# Patient Record
Sex: Female | Born: 1935 | Race: White | Hispanic: No | Marital: Married | Smoking: Former smoker
Health system: Southern US, Community
[De-identification: ages and names within clinical notes are randomized; demographics above are authoritative.]

## PROBLEM LIST (undated history)

## (undated) DIAGNOSIS — M719 Bursopathy, unspecified: Secondary | ICD-10-CM

## (undated) DIAGNOSIS — K644 Residual hemorrhoidal skin tags: Secondary | ICD-10-CM

## (undated) DIAGNOSIS — M67919 Unspecified disorder of synovium and tendon, unspecified shoulder: Secondary | ICD-10-CM

## (undated) DIAGNOSIS — Z923 Personal history of irradiation: Secondary | ICD-10-CM

## (undated) DIAGNOSIS — M899 Disorder of bone, unspecified: Secondary | ICD-10-CM

## (undated) DIAGNOSIS — K5730 Diverticulosis of large intestine without perforation or abscess without bleeding: Secondary | ICD-10-CM

## (undated) DIAGNOSIS — F329 Major depressive disorder, single episode, unspecified: Secondary | ICD-10-CM

## (undated) DIAGNOSIS — C50919 Malignant neoplasm of unspecified site of unspecified female breast: Secondary | ICD-10-CM

## (undated) DIAGNOSIS — M069 Rheumatoid arthritis, unspecified: Secondary | ICD-10-CM

## (undated) DIAGNOSIS — M949 Disorder of cartilage, unspecified: Secondary | ICD-10-CM

## (undated) DIAGNOSIS — D0510 Intraductal carcinoma in situ of unspecified breast: Secondary | ICD-10-CM

## (undated) DIAGNOSIS — K219 Gastro-esophageal reflux disease without esophagitis: Secondary | ICD-10-CM

## (undated) HISTORY — DX: Malignant neoplasm of unspecified site of unspecified female breast: C50.919

## (undated) HISTORY — PX: CHOLECYSTECTOMY: SHX55

## (undated) HISTORY — PX: APPENDECTOMY: SHX54

## (undated) HISTORY — PX: DILATION AND CURETTAGE OF UTERUS: SHX78

## (undated) HISTORY — DX: Disorder of cartilage, unspecified: M94.9

## (undated) HISTORY — DX: Diverticulosis of large intestine without perforation or abscess without bleeding: K57.30

## (undated) HISTORY — DX: Gastro-esophageal reflux disease without esophagitis: K21.9

## (undated) HISTORY — DX: Intraductal carcinoma in situ of unspecified breast: D05.10

## (undated) HISTORY — DX: Disorder of bone, unspecified: M89.9

## (undated) HISTORY — PX: ABDOMINAL HYSTERECTOMY: SHX81

## (undated) HISTORY — DX: Residual hemorrhoidal skin tags: K64.4

## (undated) HISTORY — DX: Major depressive disorder, single episode, unspecified: F32.9

## (undated) HISTORY — DX: Rheumatoid arthritis, unspecified: M06.9

## (undated) HISTORY — PX: CATARACT EXTRACTION: SUR2

## (undated) HISTORY — DX: Unspecified disorder of synovium and tendon, unspecified shoulder: M67.919

## (undated) HISTORY — DX: Bursopathy, unspecified: M71.9

## (undated) HISTORY — PX: OTHER SURGICAL HISTORY: SHX169

## (undated) HISTORY — PX: RECTOCELE REPAIR: SHX761

---

## 1997-12-08 ENCOUNTER — Ambulatory Visit (HOSPITAL_COMMUNITY): Admission: RE | Admit: 1997-12-08 | Discharge: 1997-12-08 | Payer: Self-pay | Admitting: Family Medicine

## 1997-12-10 ENCOUNTER — Ambulatory Visit (HOSPITAL_COMMUNITY): Admission: RE | Admit: 1997-12-10 | Discharge: 1997-12-10 | Payer: Self-pay | Admitting: Family Medicine

## 1997-12-10 ENCOUNTER — Encounter: Payer: Self-pay | Admitting: Family Medicine

## 1998-01-03 ENCOUNTER — Ambulatory Visit (HOSPITAL_COMMUNITY): Admission: RE | Admit: 1998-01-03 | Discharge: 1998-01-03 | Payer: Self-pay | Admitting: *Deleted

## 1998-01-03 HISTORY — PX: BREAST BIOPSY: SHX20

## 1998-01-10 ENCOUNTER — Encounter: Admission: RE | Admit: 1998-01-10 | Discharge: 1998-04-10 | Payer: Self-pay | Admitting: Radiation Oncology

## 1998-01-19 HISTORY — PX: BREAST LUMPECTOMY: SHX2

## 1998-01-21 ENCOUNTER — Ambulatory Visit (HOSPITAL_COMMUNITY): Admission: RE | Admit: 1998-01-21 | Discharge: 1998-01-21 | Payer: Self-pay | Admitting: *Deleted

## 1998-02-14 ENCOUNTER — Ambulatory Visit (HOSPITAL_COMMUNITY): Admission: RE | Admit: 1998-02-14 | Discharge: 1998-02-14 | Payer: Self-pay | Admitting: *Deleted

## 1998-02-19 DIAGNOSIS — Z923 Personal history of irradiation: Secondary | ICD-10-CM

## 1998-02-19 HISTORY — DX: Personal history of irradiation: Z92.3

## 1998-04-11 ENCOUNTER — Encounter: Admission: RE | Admit: 1998-04-11 | Discharge: 1998-07-10 | Payer: Self-pay | Admitting: Radiation Oncology

## 1998-08-04 ENCOUNTER — Encounter: Payer: Self-pay | Admitting: Oncology

## 1998-08-04 ENCOUNTER — Ambulatory Visit (HOSPITAL_COMMUNITY): Admission: RE | Admit: 1998-08-04 | Discharge: 1998-08-04 | Payer: Self-pay | Admitting: Oncology

## 1999-02-02 ENCOUNTER — Encounter: Payer: Self-pay | Admitting: Oncology

## 1999-02-02 ENCOUNTER — Ambulatory Visit (HOSPITAL_COMMUNITY): Admission: RE | Admit: 1999-02-02 | Discharge: 1999-02-02 | Payer: Self-pay | Admitting: Oncology

## 1999-03-13 ENCOUNTER — Other Ambulatory Visit: Admission: RE | Admit: 1999-03-13 | Discharge: 1999-03-13 | Payer: Self-pay | Admitting: *Deleted

## 1999-08-07 ENCOUNTER — Encounter: Payer: Self-pay | Admitting: Oncology

## 1999-08-07 ENCOUNTER — Encounter: Admission: RE | Admit: 1999-08-07 | Discharge: 1999-08-07 | Payer: Self-pay | Admitting: Oncology

## 2000-01-23 ENCOUNTER — Encounter: Payer: Self-pay | Admitting: Oncology

## 2000-01-23 ENCOUNTER — Encounter: Admission: RE | Admit: 2000-01-23 | Discharge: 2000-01-23 | Payer: Self-pay | Admitting: Oncology

## 2001-02-24 ENCOUNTER — Encounter: Payer: Self-pay | Admitting: Oncology

## 2001-02-24 ENCOUNTER — Encounter: Admission: RE | Admit: 2001-02-24 | Discharge: 2001-02-24 | Payer: Self-pay | Admitting: Oncology

## 2002-02-25 ENCOUNTER — Encounter: Admission: RE | Admit: 2002-02-25 | Discharge: 2002-02-25 | Payer: Self-pay | Admitting: Oncology

## 2002-02-25 ENCOUNTER — Encounter: Payer: Self-pay | Admitting: Oncology

## 2002-11-13 ENCOUNTER — Encounter: Payer: Self-pay | Admitting: Internal Medicine

## 2002-11-13 HISTORY — PX: COLONOSCOPY: SHX5424

## 2003-03-01 ENCOUNTER — Encounter: Admission: RE | Admit: 2003-03-01 | Discharge: 2003-03-01 | Payer: Self-pay | Admitting: Internal Medicine

## 2003-12-23 ENCOUNTER — Ambulatory Visit: Payer: Self-pay | Admitting: Oncology

## 2004-01-17 ENCOUNTER — Encounter: Payer: Self-pay | Admitting: Internal Medicine

## 2004-03-01 ENCOUNTER — Encounter: Admission: RE | Admit: 2004-03-01 | Discharge: 2004-03-01 | Payer: Self-pay | Admitting: Oncology

## 2004-03-03 ENCOUNTER — Ambulatory Visit: Payer: Self-pay | Admitting: Internal Medicine

## 2004-05-30 ENCOUNTER — Ambulatory Visit: Payer: Self-pay | Admitting: Internal Medicine

## 2004-11-28 ENCOUNTER — Ambulatory Visit: Payer: Self-pay | Admitting: Internal Medicine

## 2005-03-26 ENCOUNTER — Encounter: Admission: RE | Admit: 2005-03-26 | Discharge: 2005-03-26 | Payer: Self-pay | Admitting: Internal Medicine

## 2005-05-22 ENCOUNTER — Ambulatory Visit: Payer: Self-pay | Admitting: Internal Medicine

## 2005-05-29 ENCOUNTER — Ambulatory Visit: Payer: Self-pay | Admitting: Internal Medicine

## 2005-12-26 ENCOUNTER — Ambulatory Visit: Payer: Self-pay | Admitting: Internal Medicine

## 2006-03-28 ENCOUNTER — Encounter: Payer: Self-pay | Admitting: Internal Medicine

## 2006-03-28 ENCOUNTER — Encounter: Admission: RE | Admit: 2006-03-28 | Discharge: 2006-03-28 | Payer: Self-pay | Admitting: Internal Medicine

## 2006-04-04 ENCOUNTER — Encounter: Admission: RE | Admit: 2006-04-04 | Discharge: 2006-04-04 | Payer: Self-pay | Admitting: Internal Medicine

## 2006-04-04 ENCOUNTER — Encounter: Payer: Self-pay | Admitting: Internal Medicine

## 2006-06-04 ENCOUNTER — Ambulatory Visit: Payer: Self-pay | Admitting: Internal Medicine

## 2006-06-04 LAB — CONVERTED CEMR LAB
ALT: 16 units/L (ref 0–40)
Alkaline Phosphatase: 101 units/L (ref 39–117)
Basophils Absolute: 0 10*3/uL (ref 0.0–0.1)
Basophils Relative: 0.9 % (ref 0.0–1.0)
Calcium: 9.6 mg/dL (ref 8.4–10.5)
Chloride: 108 meq/L (ref 96–112)
Eosinophils Absolute: 0.1 10*3/uL (ref 0.0–0.6)
Eosinophils Relative: 2.3 % (ref 0.0–5.0)
Glucose, Bld: 78 mg/dL (ref 70–99)
HCT: 36.1 % (ref 36.0–46.0)
Hemoglobin: 12.6 g/dL (ref 12.0–15.0)
MCHC: 34.9 g/dL (ref 30.0–36.0)
MCV: 91.3 fL (ref 78.0–100.0)
Monocytes Absolute: 0.4 10*3/uL (ref 0.2–0.7)
Monocytes Relative: 7.5 % (ref 3.0–11.0)
Platelets: 245 10*3/uL (ref 150–400)
RBC: 3.96 M/uL (ref 3.87–5.11)
RDW: 12.9 % (ref 11.5–14.6)
Total Protein: 6.7 g/dL (ref 6.0–8.3)
WBC: 5.4 10*3/uL (ref 4.5–10.5)

## 2006-06-11 ENCOUNTER — Ambulatory Visit: Payer: Self-pay | Admitting: Internal Medicine

## 2006-07-29 ENCOUNTER — Ambulatory Visit: Payer: Self-pay | Admitting: Internal Medicine

## 2006-09-16 ENCOUNTER — Telehealth: Payer: Self-pay | Admitting: Internal Medicine

## 2006-09-24 ENCOUNTER — Encounter: Payer: Self-pay | Admitting: Internal Medicine

## 2006-11-29 ENCOUNTER — Ambulatory Visit: Payer: Self-pay | Admitting: Internal Medicine

## 2006-11-29 DIAGNOSIS — M899 Disorder of bone, unspecified: Secondary | ICD-10-CM | POA: Insufficient documentation

## 2006-11-29 DIAGNOSIS — M069 Rheumatoid arthritis, unspecified: Secondary | ICD-10-CM | POA: Insufficient documentation

## 2006-11-29 DIAGNOSIS — M949 Disorder of cartilage, unspecified: Secondary | ICD-10-CM

## 2006-11-29 HISTORY — DX: Disorder of bone, unspecified: M89.9

## 2006-11-29 HISTORY — DX: Rheumatoid arthritis, unspecified: M06.9

## 2007-02-24 ENCOUNTER — Ambulatory Visit: Payer: Self-pay | Admitting: Internal Medicine

## 2007-02-24 DIAGNOSIS — K219 Gastro-esophageal reflux disease without esophagitis: Secondary | ICD-10-CM

## 2007-02-24 DIAGNOSIS — F3289 Other specified depressive episodes: Secondary | ICD-10-CM

## 2007-02-24 DIAGNOSIS — K5730 Diverticulosis of large intestine without perforation or abscess without bleeding: Secondary | ICD-10-CM

## 2007-02-24 DIAGNOSIS — E785 Hyperlipidemia, unspecified: Secondary | ICD-10-CM | POA: Insufficient documentation

## 2007-02-24 DIAGNOSIS — F329 Major depressive disorder, single episode, unspecified: Secondary | ICD-10-CM

## 2007-02-24 HISTORY — DX: Gastro-esophageal reflux disease without esophagitis: K21.9

## 2007-02-24 HISTORY — DX: Diverticulosis of large intestine without perforation or abscess without bleeding: K57.30

## 2007-02-24 HISTORY — DX: Other specified depressive episodes: F32.89

## 2007-02-24 HISTORY — DX: Major depressive disorder, single episode, unspecified: F32.9

## 2007-04-01 ENCOUNTER — Encounter: Admission: RE | Admit: 2007-04-01 | Discharge: 2007-04-01 | Payer: Self-pay | Admitting: Internal Medicine

## 2007-06-11 ENCOUNTER — Encounter: Payer: Self-pay | Admitting: Internal Medicine

## 2007-06-12 ENCOUNTER — Ambulatory Visit: Payer: Self-pay | Admitting: Internal Medicine

## 2007-06-16 LAB — CONVERTED CEMR LAB
ALT: 16 units/L (ref 0–35)
AST: 24 units/L (ref 0–37)
Albumin: 4.1 g/dL (ref 3.5–5.2)
Basophils Absolute: 0 10*3/uL (ref 0.0–0.1)
Basophils Relative: 0 % (ref 0.0–1.0)
Bilirubin, Direct: 0.1 mg/dL (ref 0.0–0.3)
Direct LDL: 126.1 mg/dL
GFR calc Af Amer: 70 mL/min
HCT: 38.4 % (ref 36.0–46.0)
HDL: 80 mg/dL (ref >39.0)
MCV: 95.1 fL (ref 78.0–100.0)
Monocytes Absolute: 0.3 10*3/uL (ref 0.1–1.0)
Monocytes Relative: 3.2 % (ref 3.0–12.0)
Neutro Abs: 7.6 10*3/uL (ref 1.4–7.7)
Platelets: 280 10*3/uL (ref 150–400)
Sodium: 144 meq/L (ref 135–145)
Total Protein: 7.1 g/dL (ref 6.0–8.3)
WBC: 9.4 10*3/uL (ref 4.5–10.5)

## 2007-07-15 ENCOUNTER — Ambulatory Visit: Payer: Self-pay | Admitting: Internal Medicine

## 2007-07-15 DIAGNOSIS — N390 Urinary tract infection, site not specified: Secondary | ICD-10-CM | POA: Insufficient documentation

## 2007-07-15 LAB — CONVERTED CEMR LAB
Bilirubin Urine: NEGATIVE
Nitrite: POSITIVE
Protein, U semiquant: NEGATIVE
Specific Gravity, Urine: <1.005
Urobilinogen, UA: NEGATIVE
pH: 6.5

## 2007-10-29 ENCOUNTER — Encounter: Payer: Self-pay | Admitting: Internal Medicine

## 2007-12-08 ENCOUNTER — Telehealth: Payer: Self-pay | Admitting: Internal Medicine

## 2008-02-20 HISTORY — PX: ROTATOR CUFF REPAIR: SHX139

## 2008-04-19 ENCOUNTER — Telehealth: Payer: Self-pay | Admitting: Internal Medicine

## 2008-04-21 ENCOUNTER — Ambulatory Visit: Payer: Self-pay | Admitting: Internal Medicine

## 2008-04-21 ENCOUNTER — Encounter: Admission: RE | Admit: 2008-04-21 | Discharge: 2008-04-21 | Payer: Self-pay | Admitting: Internal Medicine

## 2008-06-17 ENCOUNTER — Ambulatory Visit: Payer: Self-pay | Admitting: Internal Medicine

## 2008-06-18 LAB — CONVERTED CEMR LAB
ALT: 13 U/L (ref 0–35)
AST: 20 U/L (ref 0–37)
Albumin: 4.3 g/dL (ref 3.5–5.2)
Alkaline Phosphatase: 82 U/L (ref 39–117)
BUN: 16 mg/dL (ref 6–23)
Basophils Absolute: 0.1 K/uL (ref 0.0–0.1)
Basophils Relative: 1.6 % (ref 0.0–3.0)
Bilirubin, Direct: 0.1 mg/dL (ref 0.0–0.3)
CO2: 27 meq/L (ref 19–32)
Calcium: 9.6 mg/dL (ref 8.4–10.5)
Chloride: 110 meq/L (ref 96–112)
Cholesterol: 238 mg/dL — ABNORMAL HIGH (ref 0–200)
Creatinine, Ser: 1 mg/dL (ref 0.4–1.2)
Direct LDL: 136.9 mg/dL
Eosinophils Absolute: 0.1 K/uL (ref 0.0–0.7)
Eosinophils Relative: 2.3 % (ref 0.0–5.0)
GFR calc non Af Amer: 57.65 mL/min (ref >60)
Glucose, Bld: 89 mg/dL (ref 70–99)
HCT: 36.8 % (ref 36.0–46.0)
HDL: 69.9 mg/dL (ref >39.00)
Hemoglobin: 13 g/dL (ref 12.0–15.0)
Lymphocytes Relative: 23.4 % (ref 12.0–46.0)
Lymphs Abs: 1.4 K/uL (ref 0.7–4.0)
MCHC: 35.3 g/dL (ref 30.0–36.0)
MCV: 93.1 fL (ref 78.0–100.0)
Monocytes Absolute: 0.5 K/uL (ref 0.1–1.0)
Monocytes Relative: 7.5 % (ref 3.0–12.0)
Neutro Abs: 3.9 K/uL (ref 1.4–7.7)
Neutrophils Relative %: 65.2 % (ref 43.0–77.0)
Platelets: 233 K/uL (ref 150.0–400.0)
Potassium: 4.1 meq/L (ref 3.5–5.1)
RBC: 3.95 M/uL (ref 3.87–5.11)
RDW: 13 % (ref 11.5–14.6)
Sodium: 142 meq/L (ref 135–145)
TSH: 1.68 u[IU]/mL (ref 0.35–5.50)
Total Bilirubin: 0.8 mg/dL (ref 0.3–1.2)
Total CHOL/HDL Ratio: 3
Total Protein: 7.2 g/dL (ref 6.0–8.3)
Triglycerides: 99 mg/dL (ref 0.0–149.0)
VLDL: 19.8 mg/dL (ref 0.0–40.0)
WBC: 6 10*3/microliter (ref 4.5–10.5)

## 2008-08-09 ENCOUNTER — Ambulatory Visit: Payer: Self-pay | Admitting: Internal Medicine

## 2008-08-09 DIAGNOSIS — M67919 Unspecified disorder of synovium and tendon, unspecified shoulder: Secondary | ICD-10-CM | POA: Insufficient documentation

## 2008-08-09 DIAGNOSIS — M719 Bursopathy, unspecified: Secondary | ICD-10-CM

## 2008-08-09 HISTORY — DX: Unspecified disorder of synovium and tendon, unspecified shoulder: M67.919

## 2008-12-10 ENCOUNTER — Encounter: Admission: RE | Admit: 2008-12-10 | Discharge: 2008-12-10 | Payer: Self-pay | Admitting: Orthopaedic Surgery

## 2008-12-27 ENCOUNTER — Ambulatory Visit (HOSPITAL_COMMUNITY): Admission: RE | Admit: 2008-12-27 | Discharge: 2008-12-28 | Payer: Self-pay | Admitting: Orthopaedic Surgery

## 2008-12-27 ENCOUNTER — Encounter (INDEPENDENT_AMBULATORY_CARE_PROVIDER_SITE_OTHER): Payer: Self-pay | Admitting: Orthopaedic Surgery

## 2009-02-09 ENCOUNTER — Telehealth: Payer: Self-pay | Admitting: Internal Medicine

## 2009-02-18 ENCOUNTER — Telehealth: Payer: Self-pay | Admitting: Family Medicine

## 2009-04-22 ENCOUNTER — Encounter: Admission: RE | Admit: 2009-04-22 | Discharge: 2009-04-22 | Payer: Self-pay | Admitting: Internal Medicine

## 2009-04-22 LAB — HM MAMMOGRAPHY: HM Mammogram: NEGATIVE

## 2009-05-30 ENCOUNTER — Telehealth: Payer: Self-pay

## 2009-06-22 ENCOUNTER — Ambulatory Visit: Payer: Self-pay | Admitting: Internal Medicine

## 2009-06-22 LAB — CONVERTED CEMR LAB
ALT: 15 units/L (ref 0–35)
BUN: 15 mg/dL (ref 6–23)
Basophils Absolute: 0 10*3/uL (ref 0.0–0.1)
Bilirubin, Direct: 0 mg/dL (ref 0.0–0.3)
Chloride: 106 meq/L (ref 96–112)
Cholesterol: 245 mg/dL — ABNORMAL HIGH (ref 0–200)
Creatinine, Ser: 1.1 mg/dL (ref 0.4–1.2)
Eosinophils Relative: 2.9 % (ref 0.0–5.0)
Glucose, Bld: 82 mg/dL (ref 70–99)
Hemoglobin: 12.9 g/dL (ref 12.0–15.0)
MCV: 94.4 fL (ref 78.0–100.0)
Monocytes Absolute: 0.5 10*3/uL (ref 0.1–1.0)
Neutro Abs: 4.1 10*3/uL (ref 1.4–7.7)
Neutrophils Relative %: 64.6 % (ref 43.0–77.0)
Potassium: 4.3 meq/L (ref 3.5–5.1)
RBC: 4.04 M/uL (ref 3.87–5.11)
TSH: 2.64 microintl units/mL (ref 0.35–5.50)
Triglycerides: 156 mg/dL — ABNORMAL HIGH (ref 0.0–149.0)
VLDL: 31.2 mg/dL (ref 0.0–40.0)

## 2009-06-30 ENCOUNTER — Telehealth: Payer: Self-pay

## 2009-09-19 ENCOUNTER — Telehealth: Payer: Self-pay | Admitting: Internal Medicine

## 2010-01-19 ENCOUNTER — Telehealth: Payer: Self-pay | Admitting: Internal Medicine

## 2010-03-12 ENCOUNTER — Encounter: Payer: Self-pay | Admitting: Internal Medicine

## 2010-03-21 NOTE — Progress Notes (Signed)
Summary: rerfill alprazolam  Phone Note Refill Request Message from:  Fax from Pharmacy on January 19, 2010 10:24 AM  Refills Requested: Medication #1:  ALPRAZOLAM 1 MG  TABS 1 once daily [facility]    Method Requested: Fax to Local Pharmacy Initial call taken by: Duard Brady LPN,  January 19, 2010 10:25 AM    Prescriptions: ALPRAZOLAM 1 MG  TABS (ALPRAZOLAM) 1 once daily  #90 x 1   Entered by:   Duard Brady LPN   Authorized by:   Gordy Savers  MD   Signed by:   Duard Brady LPN on 91/47/8295   Method used:   Historical   RxID:   [id number]  faxed back to [facility]   kik

## 2010-03-21 NOTE — Progress Notes (Signed)
Summary: lab results  Phone Note Call from Patient   Caller: Patient Call For: Gordy Savers  MD Reason for Call: Lab or Test Results Summary of Call: Pt is asking for lab results from last week. [phone] Initial call taken by: Lynann Beaver CMA,  Jun 30, 2009 4:43 PM  Follow-up for Phone Call        all lab is OK except mild elevation of cholesterol but with high  "good " cholesterol Follow-up by: Gordy Savers  MD,  Jun 30, 2009 5:00 PM  Additional Follow-up for Phone Call Additional follow up Details #1::        spoke with pt - discussed labs - dicussed diet and exercise. Pt request copy of lab to be sent to home address. KIK Additional Follow-up by: Duard Brady LPN,  Jul 01, 2009 8:58 AM    Additional Follow-up for Phone Call Additional follow up Details #2::    mail labs to home address  KIK Follow-up by: Duard Brady LPN,  Jul 04, 2009 10:18 AM

## 2010-03-21 NOTE — Progress Notes (Signed)
Summary: med ?  Phone Note Call from Patient   Caller: Patient Call For: Gordy Savers  MD Summary of Call: Pt has been taking 2.5 mg. of Prednisone, and wants to discontinue.  How should she taper? [phone] Initial call taken by: Lynann Beaver CMA,  September 19, 2009 1:15 PM  Follow-up for Phone Call        suggested she take the medication every other day in the morning for 4 weeks;  if she feels well on this regimen may discontinued after one month Follow-up by: Gordy Savers  MD,  September 19, 2009 5:02 PM  Additional Follow-up for Phone Call Additional follow up Details #1::        LMTCB  Pt notified. Lynann Beaver CMA  September 20, 2009 9:40 AM  Additional Follow-up by: Lynann Beaver CMA,  September 20, 2009 8:15 AM

## 2010-03-21 NOTE — Progress Notes (Signed)
Summary: Pt said prednisone hasnt been called in yet. Pls call in today  Phone Note Call from Patient Call back at Home Phone [phone]   Caller: Patient Summary of Call: Pt called and said that her script for Prednisone has not been called in yet for 1mg . Pt went on saturday to pick up and nothing had been done. Please call in to [facility] today. Pt wants to pick this up this afternoon.  Pt has sch an ov for Jun 22, 2009.    Initial call taken by: Lucy Antigua,  May 30, 2009 1:55 PM  Follow-up for Phone Call        spoke with pt -rx for predinosone called to [facility] - will need to keep cpx . KIK Follow-up by: Duard Brady LPN,  May 30, 2009 2:04 PM

## 2010-03-21 NOTE — Assessment & Plan Note (Signed)
Summary: EMP--WILL FAST//CCM   Vital Signs:  Patient profile:   77 year old female Height:      63.25 inches Weight:      128 pounds BMI:     22.58 Temp:     97.7 degrees F oral BP sitting:   130 / 70  (right arm) Cuff size:   regular  Vitals Entered By: Duard Brady LPN (Jun 22, 1608 9:25 AM) CC: cpx - fasting for labs - doing well  Is Patient Diabetic? No   CC:  cpx - fasting for labs - doing well .  History of Present Illness: 77 -year-old patient who is seen today for a comprehensive evaluation.  She has a history of seronegative RA well-controlled on low-dose prednisone therapy.  She has a history of osteopenia.  She has dyslipidemia gastroesophageal read clocks disease.  Her last visit here she has had left shoulder surgery and has done quite well. Complaints today include paroxysms of nausea and vomiting he seem to be associated with brief episodes of diarrhea as well they occur infrequently but quite bothersome.  She is up-to-date on her colonoscopies.  She mammogram done this past brain. Here for Medicare AWV:  1.   Risk factors based on Past M, S, F history:  cardiac risk factors include a history of mild dyslipidemia only 2.   Physical Activities: remains active in all aspects of daily living; walks on a regular basis 3.   Depression/mood:  history of depression which has been stable. 4.   Hearing: no deficits 5.   ADL's: the limitations 6.   Fall Risk: low 7.   Home Safety: no problems identified 8.   Height, weight, &visual acuity:  height and weight stable visual acuity normal has had a recent eye exam with Dr. Sherryle Lis 9.   Counseling: have recommended more vigorous home exercises for her right shoulder to help improve range of motion 10.   Labs ordered based on risk factors: laboratory studies will be reviewed including a sedimentation rate 11.           Referral Coordination non-applicable 12.           Care Plan- heart healthy diet and more regular exercise  regimen discussed 13.            Cognitive Assessment- alert and oriented with normal affect   Preventive Screening-Counseling & Management  Alcohol-Tobacco     Smoking Status: quit  Allergies: 1)  ! Amoxicillin (Amoxicillin)  Past History:  Past Medical History: Reviewed history from 06/17/2008 and no changes required. 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 father history prostate cancer mother history of lung cancer, coronary artery disease  Four brothers, renal failure, CAD one sister, obesity DJD  Social History: Reviewed history from 06/17/2008 and no changes required. Married Regular exercise-yes Former Smoker-d/c 30 yrs ago  Review of Systems  The patient denies anorexia, fever, weight loss, weight gain, vision loss, decreased hearing, hoarseness, chest pain, syncope, dyspnea on exertion, peripheral edema, prolonged cough, headaches, hemoptysis, abdominal pain, melena, hematochezia, severe indigestion/heartburn, hematuria, incontinence, genital sores, muscle weakness, suspicious skin lesions, transient blindness, difficulty walking, depression, unusual weight change, abnormal bleeding, enlarged lymph nodes, angioedema, and breast masses.  Physical Exam  General:  Well-developed,well-nourished,in no acute distress; alert,appropriate and cooperative throughout examination Head:  Normocephalic and atraumatic without obvious abnormalities. No apparent alopecia or balding. Eyes:  No corneal or conjunctival inflammation noted. EOMI. Perrla. Funduscopic exam benign, without hemorrhages, exudates or papilledema.  Vision grossly normal. Ears:  External ear exam shows no significant lesions or deformities.  Otoscopic examination reveals clear canals, tympanic membranes are intact bilaterally without bulging, retraction, inflammation or discharge. Hearing is grossly normal bilaterally. Nose:  External nasal examination shows no deformity or inflammation. Nasal mucosa are pink and moist without lesions or exudates. Mouth:  Oral mucosa and oropharynx without lesions or exudates.  dentures in place Neck:  No deformities, masses, or tenderness noted. Chest Wall:  No deformities, masses, or tenderness noted. Breasts:  No mass, nodules, thickening, tenderness, bulging, retraction, inflamation, nipple discharge or skin changes noted.   Lungs:  Normal respiratory effort, chest expands symmetrically. Lungs are clear to auscultation, no crackles or wheezes. Heart:  Normal rate and regular rhythm. S1 and S2 normal without gallop, murmur, click, rub or other extra sounds. Abdomen:  Bowel sounds positive,abdomen soft and non-tender without masses, organomegaly or hernias noted. Rectal:  declines Genitalia:  declines Msk:  No deformity or scoliosis noted of thoracic or lumbar spine.   no active synovitis Pulses:  pedal pulses, full Extremities:  No clubbing, cyanosis, edema, or deformity noted with normal full range of motion of all joints.   Neurologic:  alert & oriented X3, strength normal in all extremities, gait normal, and DTRs symmetrical and normal.  alert & oriented X3, strength normal in all extremities, gait normal, and DTRs symmetrical and normal.   Skin:  Intact without suspicious lesions or rashes Cervical Nodes:  No lymphadenopathy noted Axillary Nodes:  No palpable lymphadenopathy Inguinal Nodes:  No significant adenopathy Psych:  Cognition and judgment appear intact. Alert and cooperative with normal attention span and concentration. No apparent delusions, illusions, hallucinations   Impression &  Recommendations:  Problem # 1:  PREVENTIVE HEALTH CARE (ICD-V70.0)  Orders: EKG w/ Interpretation (93000) First annual wellness visit with prevention plan  (Z3086)  Complete Medication List: 1)  Wellbutrin Xl 300 Mg Tb24 (Bupropion hcl) .Marland Kitchen.. 1 once daily 2)  Alprazolam 1 Mg Tabs (Alprazolam) .Marland Kitchen.. 1 once daily 3)  Alendronate Sodium 70 Mg Tabs (Alendronate sodium) .... One weekly 4)  Pantoprazole Sodium 40 Mg Tbec (Pantoprazole sodium) .Marland Kitchen.. 1 once daily 5)  Prednisone 1 Mg Tabs (Prednisone) .... Take one daily as directed 6)  Prednisone 2.5 Mg Tabs (Prednisone) .... One daily  Other Orders: Venipuncture (57846) TLB-Lipid Panel (80061-LIPID) TLB-BMP (Basic Metabolic Panel-BMET) (80048-METABOL) TLB-CBC Platelet - w/Differential (85025-CBCD) TLB-Hepatic/Liver Function Pnl (80076-HEPATIC) TLB-TSH (Thyroid Stimulating Hormone) (84443-TSH) TLB-Sedimentation Rate (ESR) (85652-ESR)  Patient Instructions: 1)  Limit your Sodium (Salt) to less than 2 grams a day(slightly less than 1/2 a teaspoon) to prevent fluid retention, swelling, or worsening of symptoms. 2)  It is important that you exercise regularly at least 20 minutes 5 times a week. If you develop chest pain, have severe difficulty breathing, or feel very tired , stop exercising immediately and seek medical attention. 3)  Take calcium +Vitamin D daily. Prescriptions: PREDNISONE 2.5 MG TABS (PREDNISONE) one daily  #100 x 2   Entered and Authorized by:   Gordy Savers  MD   Signed by:   Gordy Savers  MD on 06/22/2009   Method used:   Print then Give to Patient   RxID:  [id number] PREDNISONE 1 MG TABS (PREDNISONE) take one daily as directed  #100 x 2   Entered and Authorized by:   Gordy Savers  MD   Signed by:   Gordy Savers  MD on 06/22/2009   Method used:   Print then Give to Patient   RxID:   [id number] PANTOPRAZOLE SODIUM 40 MG TBEC (PANTOPRAZOLE SODIUM) 1 once daily  #90 Tablet x 1    Entered and Authorized by:   Gordy Savers  MD   Signed by:   Gordy Savers  MD on 06/22/2009   Method used:   Print then Give to Patient   RxID:   [id number] ALENDRONATE SODIUM 70 MG  TABS (ALENDRONATE SODIUM) one weekly  #12 Tablet x 6   Entered and Authorized by:   Gordy Savers  MD   Signed by:   Gordy Savers  MD on 06/22/2009   Method used:   Print then Give to Patient   RxID:   [id number] ALPRAZOLAM 1 MG  TABS (ALPRAZOLAM) 1 once daily  #90 x 1   Entered and Authorized by:   Gordy Savers  MD   Signed by:   Gordy Savers  MD on 06/22/2009   Method used:   Print then Give to Patient   RxID:   [id number] WELLBUTRIN XL 300 MG  TB24 (BUPROPION HCL) 1 once daily  #90 Tablet x 2   Entered and Authorized by:   Gordy Savers  MD   Signed by:   Gordy Savers  MD on 06/22/2009   Method used:   Print then Give to Patient   RxID:   [id number] PREDNISONE 2.5 MG TABS (PREDNISONE) one daily  #100 x 2   Entered and Authorized by:   Gordy Savers  MD   Signed by:   Gordy Savers  MD on 06/22/2009   Method used:   Print then Give to Patient   RxID:   [id number] PREDNISONE 1 MG TABS (PREDNISONE) take one daily as directed  #100 x 2   Entered and Authorized by:   Gordy Savers  MD   Signed by:   Gordy Savers  MD on 06/22/2009   Method used:   Print then Give to Patient   RxID:   [id number] PANTOPRAZOLE SODIUM 40 MG TBEC (PANTOPRAZOLE SODIUM) 1 once daily  #90 Tablet x 1   Entered and Authorized by:   Gordy Savers  MD   Signed by:   Gordy Savers  MD on 06/22/2009   Method used:   Print then Give to Patient   RxID:   [id number] ALENDRONATE SODIUM 70 MG  TABS (ALENDRONATE SODIUM) one weekly  #12 Tablet x 6   Entered and Authorized by:   Gordy Savers  MD   Signed by:   Gordy Savers  MD on 06/22/2009   Method used:   Print then Give to Patient   RxID:    [id number] ALPRAZOLAM 1 MG  TABS (ALPRAZOLAM) 1 once daily  #90 x 1   Entered and Authorized by:   Gordy Savers  MD   Signed by:   Gordy Savers  MD on 06/22/2009   Method used:   Print then Give to Patient   RxID:   [id number] WELLBUTRIN XL 300 MG  TB24 (BUPROPION HCL) 1 once daily  #90 Tablet x 2   Entered and Authorized by:   Janett Labella  Amador Cunas  MD   Signed by:   Gordy Savers  MD on 06/22/2009   Method used:   Print then Give to Patient   RxID:   [id number] PREDNISONE 2.5 MG TABS (PREDNISONE) one daily  #100 x 2   Entered and Authorized by:   Gordy Savers  MD   Signed by:   Gordy Savers  MD on 06/22/2009   Method used:   Print then Give to Patient   RxID:   [id number] PREDNISONE 1 MG TABS (PREDNISONE) take one daily as directed  #100 x 2   Entered and Authorized by:   Gordy Savers  MD   Signed by:   Gordy Savers  MD on 06/22/2009   Method used:   Print then Give to Patient   RxID:   [id number] PANTOPRAZOLE SODIUM 40 MG TBEC (PANTOPRAZOLE SODIUM) 1 once daily  #90 Tablet x 1   Entered and Authorized by:   Gordy Savers  MD   Signed by:   Gordy Savers  MD on 06/22/2009   Method used:   Print then Give to Patient   RxID:   [id number] ALENDRONATE SODIUM 70 MG  TABS (ALENDRONATE SODIUM) one weekly  #12 Tablet x 6   Entered and Authorized by:   Gordy Savers  MD   Signed by:   Gordy Savers  MD on 06/22/2009   Method used:   Print then Give to Patient   RxID:   [id number] ALPRAZOLAM 1 MG  TABS (ALPRAZOLAM) 1 once daily  #90 x 1   Entered and Authorized by:   Gordy Savers  MD   Signed by:   Gordy Savers  MD on 06/22/2009   Method used:   Print then Give to Patient   RxID:   [id number] WELLBUTRIN XL 300 MG  TB24 (BUPROPION HCL) 1 once daily  #90 Tablet x 2   Entered and Authorized by:   Gordy Savers  MD   Signed by:   Gordy Savers  MD  on 06/22/2009   Method used:   Print then Give to Patient   RxID:   [id number]   Appended Document: EMP--WILL FAST//CCM Addendum:  Seronegative RA- f/u Rheumatology osteopenia- continue calcium and vitamin D supplements Dyslipidemia- continue heart healthy diet GERD-  continue anti-reflux measures

## 2010-03-27 ENCOUNTER — Other Ambulatory Visit: Payer: Self-pay | Admitting: Internal Medicine

## 2010-03-27 DIAGNOSIS — Z1231 Encounter for screening mammogram for malignant neoplasm of breast: Secondary | ICD-10-CM

## 2010-04-25 ENCOUNTER — Ambulatory Visit
Admission: RE | Admit: 2010-04-25 | Discharge: 2010-04-25 | Disposition: A | Discharge status: Home / Self Care | Payer: Medicare Other | Source: Ambulatory Visit | Attending: Internal Medicine | Admitting: Internal Medicine

## 2010-04-25 DIAGNOSIS — Z1231 Encounter for screening mammogram for malignant neoplasm of breast: Secondary | ICD-10-CM

## 2010-05-24 LAB — URINALYSIS, ROUTINE W REFLEX MICROSCOPIC
Glucose, UA: NEGATIVE mg/dL
Ketones, ur: NEGATIVE mg/dL
Nitrite: NEGATIVE
Specific Gravity, Urine: 1.012 (ref 1.005–1.030)

## 2010-05-24 LAB — COMPREHENSIVE METABOLIC PANEL
ALT: 12 U/L (ref 0–35)
Albumin: 4.2 g/dL (ref 3.5–5.2)
BUN: 13 mg/dL (ref 6–23)
CO2: 27 mEq/L (ref 19–32)
Calcium: 10 mg/dL (ref 8.4–10.5)
Chloride: 106 mEq/L (ref 96–112)
Sodium: 141 mEq/L (ref 135–145)
Total Bilirubin: 0.5 mg/dL (ref 0.3–1.2)

## 2010-05-24 LAB — URINE MICROSCOPIC-ADD ON

## 2010-05-24 LAB — DIFFERENTIAL
Eosinophils Relative: 1 % (ref 0–5)
Lymphs Abs: 1.2 10*3/uL (ref 0.7–4.0)
Monocytes Absolute: 0.4 10*3/uL (ref 0.1–1.0)
Neutrophils Relative %: 83 % — ABNORMAL HIGH (ref 43–77)

## 2010-05-24 LAB — CBC
HCT: 38 % (ref 36.0–46.0)
Hemoglobin: 13.1 g/dL (ref 12.0–15.0)
MCHC: 34.4 g/dL (ref 30.0–36.0)

## 2010-06-26 ENCOUNTER — Encounter: Payer: Self-pay | Admitting: Internal Medicine

## 2010-06-29 ENCOUNTER — Encounter: Payer: Self-pay | Admitting: Internal Medicine

## 2010-06-29 ENCOUNTER — Ambulatory Visit (INDEPENDENT_AMBULATORY_CARE_PROVIDER_SITE_OTHER): Payer: Medicare Other | Admitting: Internal Medicine

## 2010-06-29 DIAGNOSIS — K219 Gastro-esophageal reflux disease without esophagitis: Secondary | ICD-10-CM

## 2010-06-29 DIAGNOSIS — Z136 Encounter for screening for cardiovascular disorders: Secondary | ICD-10-CM

## 2010-06-29 DIAGNOSIS — E785 Hyperlipidemia, unspecified: Secondary | ICD-10-CM

## 2010-06-29 DIAGNOSIS — M069 Rheumatoid arthritis, unspecified: Secondary | ICD-10-CM

## 2010-06-29 DIAGNOSIS — M899 Disorder of bone, unspecified: Secondary | ICD-10-CM

## 2010-06-29 DIAGNOSIS — Z0000 Encounter for general adult medical examination without abnormal findings: Secondary | ICD-10-CM

## 2010-06-29 DIAGNOSIS — M949 Disorder of cartilage, unspecified: Secondary | ICD-10-CM

## 2010-06-29 LAB — CBC WITH DIFFERENTIAL/PLATELET
Basophils Relative: 0.4 % (ref 0.0–3.0)
Eosinophils Absolute: 0.1 10*3/uL (ref 0.0–0.7)
Eosinophils Relative: 2.3 % (ref 0.0–5.0)
Hemoglobin: 12.1 g/dL (ref 12.0–15.0)
Lymphocytes Relative: 23.8 % (ref 12.0–46.0)
MCHC: 34.2 g/dL (ref 30.0–36.0)
MCV: 92.9 fl (ref 78.0–100.0)
Neutro Abs: 3.5 10*3/uL (ref 1.4–7.7)
RBC: 3.8 Mil/uL — ABNORMAL LOW (ref 3.87–5.11)
WBC: 5.2 10*3/uL (ref 4.5–10.5)

## 2010-06-29 LAB — HEPATIC FUNCTION PANEL
ALT: 11 U/L (ref 0–35)
Albumin: 4 g/dL (ref 3.5–5.2)
Alkaline Phosphatase: 101 U/L (ref 39–117)
Total Protein: 6.7 g/dL (ref 6.0–8.3)

## 2010-06-29 LAB — BASIC METABOLIC PANEL
BUN: 14 mg/dL (ref 6–23)
CO2: 29 mEq/L (ref 19–32)
Creatinine, Ser: 1 mg/dL (ref 0.4–1.2)
GFR: 58.68 mL/min — ABNORMAL LOW (ref >60.00)

## 2010-06-29 MED ORDER — BUPROPION HCL ER (XL) 300 MG PO TB24: 300.0000 mg | ORAL_TABLET | Freq: Every day | ORAL | Status: DC

## 2010-06-29 MED ORDER — ALPRAZOLAM 1 MG PO TABS: 1.0000 mg | ORAL_TABLET | Freq: Every day | ORAL | Status: DC

## 2010-06-29 MED ORDER — PANTOPRAZOLE SODIUM 40 MG PO TBEC: 40.0000 mg | DELAYED_RELEASE_TABLET | Freq: Every day | ORAL | Status: DC

## 2010-06-29 NOTE — Progress Notes (Signed)
Subjective:    Patient ID: Monique Crosby, female    DOB: [birth year], 77 y.o.   MRN: [id number]  HPI   77 year old patient who is seen today for her annual exam. She has self titrated herself off low-dose prednisone therapy. This had been prescribed for seronegative RA. She has a history of osteopenia and has also discontinued Fosamax. She is doing quite well today.1. Risk factors, based on past  M,S,F history  2.  Physical activities:  Remains quite active without exercise limitation  3.  Depression/mood: No history of depression or mood disorder. Does take Xanax infrequently due to anxiety  4.  Hearing: No deficits  5.  ADL's: Independent in all aspects of daily living  6.  Fall risk: Low  7.  Home safety: No problems identified  8.  Height weight, and visual acuity; height and weight stable. Weight is down a few pounds compared to her last exam. No low change in visual acuity  9.  Counseling: Heart healthy diet regular exercise encouraged she is on calcium and vitamin D supplements  10. Lab orders based on risk factors: Laboratory profile will be reviewed  11. Referral : Not appropriate at this time. Has had a recent mammogram  12. Care plan: Heart healthy diet annual mammogram and annual health assessment encouraged  13. Cognitive assessment: Alert and appropriate with normal affect no cognitive dysfunction    History of Present Illness:  85 -year-old patient who is seen today for a comprehensive evaluation. She has a history of seronegative RA well-controlled on low-dose prednisone therapy. She has a history of osteopenia. She has dyslipidemia gastroesophageal read clocks disease. Her last visit here she has had left shoulder surgery and has done quite well.  Complaints today include paroxysms of nausea and vomiting he seem to be associated with brief episodes of diarrhea as well they occur infrequently but quite bothersome. She is up-to-date on her colonoscopies. She mammogram  done this past brain.  Here for Medicare AWV:  1. Risk factors based on Past M, S, F history: cardiac risk factors include a history of mild dyslipidemia only  2. Physical Activities: remains active in all aspects of daily living; walks on a regular basis  3. Depression/mood: history of depression which has been stable.  4. Hearing: no deficits  5. ADL's: the limitations  6. Fall Risk: low  7. Home Safety: no problems identified  8. Height, weight, &visual acuity: height and weight stable visual acuity normal has had a recent eye exam with Dr. Sherryle Lis  9. Counseling: have recommended more vigorous home exercises for her right shoulder to help improve range of motion  10. Labs ordered based on risk factors: laboratory studies will be reviewed including a sedimentation rate  11. Referral Coordination non-applicable  12. Care Plan- heart healthy diet and more regular exercise regimen discussed  13. Cognitive Assessment- alert and oriented with normal affect  Preventive Screening-Counseling & Management  Alcohol-Tobacco  Smoking Status: quit  Allergies:  1) ! Amoxicillin (Amoxicillin)  Past History:  Past Medical History:  Reviewed history from 06/17/2008 and no changes required.  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  father history prostate cancer  mother history of lung cancer, coronary artery disease  Four brothers, renal failure, CAD  one sister, obesity DJD  Social History:  Reviewed history from 06/17/2008 and no changes required.  Married  Regular exercise-yes  Former Smoker-d/c 30 yrs ago     [employer] from Last 3 Encounters:  06/29/10 122 lb (55.339 kg)  06/22/09 128 lb (58.06 kg)  08/09/08 133 lb (60.328 kg)      Review of Systems  Constitutional: Negative for fever, appetite change, fatigue and unexpected weight change.  HENT: Negative for hearing loss, ear pain, nosebleeds, congestion, sore throat, mouth sores, trouble swallowing, neck stiffness, dental problem, voice change, sinus pressure and tinnitus.   Eyes: Negative for photophobia, pain, redness and visual disturbance.  Respiratory: Negative for cough, chest tightness and shortness of breath.   Cardiovascular: Negative for chest pain, palpitations and leg swelling.  Gastrointestinal: Negative for nausea, vomiting, abdominal pain, diarrhea, constipation, blood in stool, abdominal distention and rectal pain.  Genitourinary: Negative for dysuria, urgency, frequency, hematuria, flank pain, vaginal bleeding, vaginal discharge, difficulty urinating, genital sores, vaginal pain, menstrual problem and pelvic pain.  Musculoskeletal: Negative for back pain and arthralgias.  Skin: Negative for rash.  Neurological: Negative for dizziness, syncope, speech difficulty, weakness, light-headedness, numbness and headaches.  Hematological: Negative for adenopathy. Does not bruise/bleed easily.  Psychiatric/Behavioral: Negative for suicidal ideas, behavioral problems, self-injury, dysphoric mood and agitation. The patient is not nervous/anxious.        Objective:   Physical Exam  Constitutional: She is oriented to person, place, and time. She appears well-developed and well-nourished.  HENT:  Head: Normocephalic and atraumatic.  Right Ear: External ear normal.  Left Ear: External ear normal.  Mouth/Throat: Oropharynx is clear and moist.  Eyes: Conjunctivae and EOM are normal.  Neck: Normal range of motion. Neck supple. No JVD present. No thyromegaly present.  Cardiovascular: Normal rate, regular rhythm, normal heart sounds and  intact distal pulses.   No murmur heard. Pulmonary/Chest: Effort normal and breath sounds normal. She has no wheezes. She has no rales.  Abdominal: Soft. Bowel sounds are normal. She exhibits no distension and no mass. There is no tenderness. There is no rebound and no guarding.  Genitourinary: Vagina normal. Guaiac negative stool. No vaginal discharge found.  Musculoskeletal: Normal range of motion. She exhibits no edema and no tenderness.  Neurological: She is alert and oriented to person, place, and time. She has normal reflexes. No cranial nerve deficit. She exhibits normal muscle tone. Coordination normal.  Skin: Skin is warm and dry. No rash noted.  Psychiatric: She has a normal mood and affect. Her behavior is normal.          Assessment & Plan:   Unremarkable clinical exam Mild dyslipidemia with a high HDL Osteopenia. We'll continue calcium and vitamin D supplements History of seronegative RA. Quiescent

## 2010-06-29 NOTE — Patient Instructions (Signed)
Take a calcium supplement, plus 800-1200 units of vitamin D  Limit your sodium (Salt) intake    It is important that you exercise regularly, at least 20 minutes 3 to 4 times per week.  If you develop chest pain or shortness of breath seek  medical attention.  Return in one year for follow-up  

## 2010-07-04 NOTE — Assessment & Plan Note (Signed)
[facility]                            [facility] OFFICE NOTE   YARENIS, CERINO                        MRN:          [id number]  DATE:07/29/2006                            DOB:          May 16, 1932    A 77 year old female was seen approximately 6 weeks ago for an annual  physical.  At that time she presented with a 6-week history of pain and  stiffness involving the hands and wrists, primarily the MCP joints.  She  described one hour of early morning stiffness.  She did have a anti-CCP  antibody that was negative as well as a negative rheumatoid factor.  She  responded dramatically to a prednisone dose pack within 48 hours and  really did well until approximately 9 days ago.  Since that time, she  has had increasing pain and stiffness involving the hands and the  wrists.  She has developed nodules involving both ulnar sides of the  wrists.   PHYSICAL EXAMINATION:  MUSCULOSKELETAL:  Reveals active synovitis  involving the wrists and primarily the 1st and 2nd MCP joints of both  hands.   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   DISPOSITION:  She will be resumed on prednisone and be set up for a  rheumatology evaluation.     Gordy Savers, MD  Electronically Signed    PFK/MedQ  DD: 07/29/2006  DT: 07/29/2006  Job #: [id number]

## 2010-07-07 NOTE — Assessment & Plan Note (Signed)
[facility] OFFICE NOTE   Monique Crosby, Monique Crosby                        MRN:          [id number]  DATE:06/11/2006                            DOB:          09/09/1932    The patient is a 77 year old female who is seen today for an annual  physical.  She presents with a 6 week history of pain and stiffness  involving both hands.  This involves primarily the wrist and the MCP  joints.  She describes 1 hour of early morning stiffness.  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  She has  stable CLL.  CBC was unremarkable today.   DIAGNOSES:  Include:  1. Gastroesophageal reflux disease.  2. History of depression.  3. Hypercholesterolemia.  4. Diverticulosis.   She is gravida 1, para 1, abortus 0.   SURGICAL PROCEDURES:  1. Remote appendectomy.  2. Cholecystectomy.  3. Total abdominal hysterectomy.   REVIEW OF SYSTEM EXAM:  Negative.  She had colonoscopy in 2004.   FAMILY HISTORY:  Unchanged.   EXAM:  A healthy appearing female in no acute distress.  Blood pressures  130/80.  Skin negative.  RHEUMATOLOGIC EXAM:  Considerable tenderness, swelling, excessive warmth  involving both wrists and the first three MCP joints of both hands.  FUNDI, EAR, NOSE AND THROAT:  Clear.  NECK:  No bruits.  CHEST:  Clear.  BREASTS:  Negative.  CARDIOVASCULAR:  Normal heart sounds, no murmurs.  ABDOMEN:  Soft and nontender.  A surgical scar is noted.  No  organomegaly or masses.  PELVIC EXAM:  Absent uterus without adnexal masses.  EXTREMITIES:  Negative.  NEURO:  Negative.   IMPRESSION:  1. Inflammatory arthritis.  2. History of chronic lymphocytic leukemia.  3. History of left breast cancer.  4. Degenerative joint disease.  5. Gastroesophageal reflux disease.   DISPOSITION:  1. Will treat with prednisone Dose-Pak.  2. Arthritis serology will be reviewed.  3. Following the prednisone  Dose-Pak, will place on Naproxen 500      b.i.d.  4. Will consider rheumatologic referral.  5. Otherwise, her medical regimen unchanged.     Gordy Savers, MD  Electronically Signed    PFK/MedQ  DD: 06/11/2006  DT: 06/11/2006  Job #: [id number]

## 2010-07-11 ENCOUNTER — Telehealth: Payer: Self-pay

## 2010-07-11 NOTE — Telephone Encounter (Signed)
Opened in error

## 2010-09-19 ENCOUNTER — Encounter: Payer: Self-pay | Admitting: Internal Medicine

## 2010-09-19 ENCOUNTER — Ambulatory Visit (INDEPENDENT_AMBULATORY_CARE_PROVIDER_SITE_OTHER): Payer: Medicare Other | Admitting: Internal Medicine

## 2010-09-19 VITALS — BP 134/90 | HR 80 | Temp 98.2°F | Wt 122.0 lb

## 2010-09-19 DIAGNOSIS — K219 Gastro-esophageal reflux disease without esophagitis: Secondary | ICD-10-CM

## 2010-09-19 MED ORDER — METOCLOPRAMIDE HCL 5 MG PO TABS: 5.0000 mg | ORAL_TABLET | Freq: Four times a day (QID) | ORAL | Status: AC

## 2010-09-19 NOTE — Patient Instructions (Signed)
Avoids foods high in acid such as tomatoes citrus juices, and spicy foods.  Avoid eating within two hours of lying down or before exercising.  Do not overheat.  Try smaller more frequent meals.  If symptoms persist, elevate the head of her bed 12 inches while sleeping.  Call or return to clinic prn if these symptoms worsen or fail to improve as anticipated.  

## 2010-09-19 NOTE — Progress Notes (Signed)
  Subjective:    Patient ID: Monique Crosby, female    DOB: April 20, 1932, 77 y.o.   MRN: [id number]  HPI 77 year old patient who presents with a 6-7 day history of epigastric and chest pain she does have a history of chronic gastroesophageal reflux disease and prior to one week ago was taking Protonix 3-4 times per week over the past week she has had epigastric pain that radiates to the substernal area it is described as quite sharp painful and relieved by belching. Denies any shortness of breath or nausea. Symptoms occur frequent throughout the day and also at night. No exertional symptoms. Over the past week she has been taking her Protonix daily without much benefit   Review of Systems  Constitutional: Negative.   HENT: Negative for hearing loss, congestion, sore throat, rhinorrhea, dental problem, sinus pressure and tinnitus.   Eyes: Negative for pain, discharge and visual disturbance.  Respiratory: Negative for cough and shortness of breath.   Cardiovascular: Positive for chest pain. Negative for palpitations and leg swelling.  Gastrointestinal: Negative for nausea, vomiting, abdominal pain, diarrhea, constipation, blood in stool and abdominal distention.  Genitourinary: Negative for dysuria, urgency, frequency, hematuria, flank pain, vaginal bleeding, vaginal discharge, difficulty urinating, vaginal pain and pelvic pain.  Musculoskeletal: Negative for joint swelling, arthralgias and gait problem.  Skin: Negative for rash.  Neurological: Negative for dizziness, syncope, speech difficulty, weakness, numbness and headaches.  Hematological: Negative for adenopathy.  Psychiatric/Behavioral: Negative for behavioral problems, dysphoric mood and agitation. The patient is not nervous/anxious.        Objective:   Physical Exam  Constitutional: She is oriented to person, place, and time. She appears well-developed and well-nourished. No distress.  HENT:  Head: Normocephalic.  Right Ear: External  ear normal.  Left Ear: External ear normal.  Mouth/Throat: Oropharynx is clear and moist.  Eyes: Conjunctivae and EOM are normal. Pupils are equal, round, and reactive to light.  Neck: Normal range of motion. Neck supple. No thyromegaly present.  Cardiovascular: Normal rate, regular rhythm, normal heart sounds and intact distal pulses.   Pulmonary/Chest: Effort normal and breath sounds normal.  Abdominal: Soft. Bowel sounds are normal. She exhibits no mass. There is tenderness.       Very mild epigastric tenderness  Musculoskeletal: Normal range of motion.  Lymphadenopathy:    She has no cervical adenopathy.  Neurological: She is alert and oriented to person, place, and time.  Skin: Skin is warm and dry. No rash noted.  Psychiatric: She has a normal mood and affect. Her behavior is normal.          Assessment & Plan:   No problem-specific assessment & plan notes found for this encounter.  Epigastric and substernal chest pain. Believe this represents reflux symptoms. Will increase her proton next short term to a twice a day regimen. Aggressive antireflux measures will be instituted. She'll also be placed on short-term Reglan.

## 2010-10-10 ENCOUNTER — Other Ambulatory Visit: Payer: Self-pay | Admitting: Internal Medicine

## 2010-10-10 NOTE — Telephone Encounter (Signed)
No RF

## 2010-10-10 NOTE — Telephone Encounter (Signed)
I dont see this on med list from most recent visit , or old cpx 06/2009  Please advise

## 2010-10-30 ENCOUNTER — Encounter: Payer: Self-pay | Admitting: Internal Medicine

## 2010-10-30 ENCOUNTER — Ambulatory Visit (INDEPENDENT_AMBULATORY_CARE_PROVIDER_SITE_OTHER): Payer: Medicare Other | Admitting: Internal Medicine

## 2010-10-30 VITALS — BP 110/74 | Temp 98.3°F | Wt 121.0 lb

## 2010-10-30 DIAGNOSIS — Z0000 Encounter for general adult medical examination without abnormal findings: Secondary | ICD-10-CM

## 2010-10-30 DIAGNOSIS — Z23 Encounter for immunization: Secondary | ICD-10-CM

## 2010-10-30 DIAGNOSIS — K219 Gastro-esophageal reflux disease without esophagitis: Secondary | ICD-10-CM

## 2010-10-30 MED ORDER — METOCLOPRAMIDE HCL 5 MG PO TABS: ORAL_TABLET | ORAL | Status: DC

## 2010-10-30 MED ORDER — PANTOPRAZOLE SODIUM 40 MG PO TBEC: DELAYED_RELEASE_TABLET | ORAL | Status: DC

## 2010-10-30 NOTE — Patient Instructions (Signed)
Avoids foods high in acid such as tomatoes citrus juices, and spicy foods.  Avoid eating within two hours of lying down or before exercising.  Do not overheat.  Try smaller more frequent meals.  If symptoms persist, elevate the head of her bed 12 inches while sleeping.  Acid Reflux (GERD) Acid reflux is also called gastroesophageal reflux disease (GERD). Your stomach makes acid to help digest food. Acid reflux happens when acid from your stomach goes into the tube between your mouth and stomach (esophagus). Your stomach is protected from the acid, but this tube is not. When acid gets into the tube, it may cause a burning feeling in the chest (heartburn). Besides heartburn, other health problems can happen if the acid keeps going into the tube. Some causes of acid reflux include:  Being overweight.   Smoking.   Drinking alcohol.   Eating large meals.   Eating meals and then going to bed right away.   Eating certain foods.   Increased stomach acid production.  HOME CARE  Take all medicine as told by your doctor.   You may need to:   Lose weight.   Avoid alcohol.   Quit smoking.   Do not eat big meals. It is better to eat smaller meals throughout the day.   Do not eat a meal and then nap or go to bed.   Sleep with your head higher than your stomach.   Avoid foods that bother you.   You may need more tests, or you may need to see a special doctor.  GET HELP RIGHT AWAY IF:  You have chest pain that is different than before.   You have pain that goes to your arms, jaw, or between your shoulder blades.   You throw up (vomit) blood, dark brown liquid, or your throw up looks like coffee grounds.   You have trouble swallowing.   You have trouble breathing or cannot stop coughing.   You feel dizzy or pass out.   Your skin is cool, wet, and pale.   Your medicine is not helping.  MAKE SURE YOU:    Understand these instructions.   Will watch your condition.   Will get  help right away if you are not doing well or get worse.  Document Released: 07/25/2007 Document Re-Released: 05/02/2009 [employer] Patient Information 2011 [employer]. 

## 2010-10-30 NOTE — Progress Notes (Signed)
  Subjective:    Patient ID: Monique Crosby, female    DOB: 23-Sep-1932, 77 y.o.   MRN: [id number]  HPI  Wt Readings from Last 3 Encounters:  10/30/10 121 lb (54.885 kg)  09/19/10 122 lb (55.339 kg)  06/29/10 122 lb (55.339 kg)      Review of Systems     Objective:   Physical Exam        Assessment & Plan:

## 2010-10-30 NOTE — Progress Notes (Signed)
  Subjective:    Patient ID: Monique Crosby, female    DOB: 10-Oct-1932, 77 y.o.   MRN: [id number]  HPI  77 year old patient who is seen today for followup of her severe GERD. She did extremely well on Protonix twice a day and short-term Reglan. Presently she is on daily Protonix and having considerable symptoms especially following her evening meal and throughout the night. She is on aggressive antireflux regimen including raising the head of the bed. Her weight has been stable and she denies any swallowing difficulty. No nausea vomiting    Review of Systems  Constitutional: Negative.   HENT: Negative for hearing loss, congestion, sore throat, rhinorrhea, dental problem, sinus pressure and tinnitus.   Eyes: Negative for pain, discharge and visual disturbance.  Respiratory: Negative for cough and shortness of breath.   Cardiovascular: Negative for chest pain, palpitations and leg swelling.  Gastrointestinal: Negative for nausea, vomiting, abdominal pain, diarrhea, constipation, blood in stool and abdominal distention.  Genitourinary: Negative for dysuria, urgency, frequency, hematuria, flank pain, vaginal bleeding, vaginal discharge, difficulty urinating, vaginal pain and pelvic pain.  Musculoskeletal: Negative for joint swelling, arthralgias and gait problem.  Skin: Negative for rash.  Neurological: Negative for dizziness, syncope, speech difficulty, weakness, numbness and headaches.  Hematological: Negative for adenopathy.  Psychiatric/Behavioral: Negative for behavioral problems, dysphoric mood and agitation. The patient is not nervous/anxious.        Objective:   Physical Exam  Constitutional: She is oriented to person, place, and time. She appears well-developed and well-nourished.  HENT:  Head: Normocephalic.  Right Ear: External ear normal.  Left Ear: External ear normal.  Mouth/Throat: Oropharynx is clear and moist.  Eyes: Conjunctivae and EOM are normal. Pupils are equal, round,  and reactive to light.  Neck: Normal range of motion. Neck supple. No thyromegaly present.  Cardiovascular: Normal rate, regular rhythm, normal heart sounds and intact distal pulses.   Pulmonary/Chest: Effort normal and breath sounds normal.  Abdominal: Soft. Bowel sounds are normal. She exhibits no mass. There is no tenderness.       Epigastric tenderness has resolved  Musculoskeletal: Normal range of motion.  Lymphadenopathy:    She has no cervical adenopathy.  Neurological: She is alert and oriented to person, place, and time.  Skin: Skin is warm and dry. No rash noted.  Psychiatric: She has a normal mood and affect. Her behavior is normal.          Assessment & Plan:   Severe GERD. Will increase Protonix to twice a day regimen chronically. We'll again treat with short-term Reglan prior to her evening meal and bedtime.

## 2010-12-01 ENCOUNTER — Ambulatory Visit (INDEPENDENT_AMBULATORY_CARE_PROVIDER_SITE_OTHER): Payer: Medicare Other | Admitting: Internal Medicine

## 2010-12-01 ENCOUNTER — Encounter: Payer: Self-pay | Admitting: Internal Medicine

## 2010-12-01 VITALS — BP 120/70 | Temp 98.0°F | Wt 120.0 lb

## 2010-12-01 DIAGNOSIS — K219 Gastro-esophageal reflux disease without esophagitis: Secondary | ICD-10-CM

## 2010-12-01 NOTE — Progress Notes (Signed)
  Subjective:    Patient ID: Monique Crosby, female    DOB: April 29, 1932, 77 y.o.   MRN: [id number]  HPI  77 year old patient who is seen today for followup. She has a history of moderate severe reflux. She is doing reasonable well and Protonix 40 mg twice daily. Reglan has been discontinued. Symptoms are worse at night. Presently the head of her bed is raised only 3 inches and he was encouraged to raise the head of the bed 6-8 inches. She is on a fairly aggressive anti-reflux regimen. Reglan caused diarrhea.  Review of Systems  Gastrointestinal: Positive for abdominal pain and diarrhea. Negative for nausea, vomiting and abdominal distention.       Objective:   Physical Exam  Constitutional: She is oriented to person, place, and time. She appears well-developed and well-nourished.  HENT:  Head: Normocephalic.  Right Ear: External ear normal.  Left Ear: External ear normal.  Mouth/Throat: Oropharynx is clear and moist.  Eyes: Conjunctivae and EOM are normal. Pupils are equal, round, and reactive to light.  Neck: Normal range of motion. Neck supple. No thyromegaly present.  Cardiovascular: Normal rate, regular rhythm, normal heart sounds and intact distal pulses.   Pulmonary/Chest: Effort normal and breath sounds normal.  Abdominal: Soft. Bowel sounds are normal. She exhibits no distension and no mass. There is no tenderness. There is no rebound and no guarding.  Musculoskeletal: Normal range of motion.  Lymphadenopathy:    She has no cervical adenopathy.  Neurological: She is alert and oriented to person, place, and time.  Skin: Skin is warm and dry. No rash noted.  Psychiatric: She has a normal mood and affect. Her behavior is normal.          Assessment & Plan:   Moderately severe gastroesophageal reflux disease. Will continue Protonix 40 twice a day. We'll attempt to raise the head of her bed an additional 3-4 inches. She will call if she fails to improve or symptoms worsen. May  need GI referral.

## 2010-12-01 NOTE — Patient Instructions (Signed)
Avoids foods high in acid such as tomatoes citrus juices, and spicy foods.  Avoid eating within two hours of lying down or before exercising.  Do not overheat.  Try smaller more frequent meals.  If symptoms persist, elevate the head of her bed 12 inches while sleeping.  Call or return to clinic prn if these symptoms worsen or fail to improve as anticipated.  

## 2010-12-20 ENCOUNTER — Encounter: Payer: Self-pay | Admitting: Internal Medicine

## 2011-01-01 ENCOUNTER — Telehealth: Payer: Self-pay | Admitting: Internal Medicine

## 2011-01-01 NOTE — Telephone Encounter (Signed)
Patient is c/o reflux.  She reports that protonix BID is not helping.  She is asking for some advise.  Patient instructed to maintain an anti-reflux diet. Advised to avoid caffeine, mint, citrus foods/juices, tomatoes,  chocolate, NSAIDS/ASA products.  Instructed not to eat within 2 hours of exercise or bed, multiple small meals are better than 3 large meals.  Need to take PPI 30 minutes prior to 1st meal of the day. Patient is also asked to try gas-x or phazyme ac meals.  I have moved her appt up to 01/19/11.  She is asked to call back and check on earlier appts.

## 2011-01-03 ENCOUNTER — Telehealth: Payer: Self-pay | Admitting: Internal Medicine

## 2011-01-03 ENCOUNTER — Telehealth: Payer: Self-pay | Admitting: Gastroenterology

## 2011-01-03 NOTE — Telephone Encounter (Signed)
Left a return call message on patient's voice mail.

## 2011-01-03 NOTE — Telephone Encounter (Signed)
Talked to the patient about increased acid reflux and she stated that she is not taking Reglan because it was causing chest discomfort.

## 2011-01-03 NOTE — Telephone Encounter (Signed)
Patient called questioning if she could take extra OTC medication during the night to help with her acid reflux. Patient sated it is so bad that it wakes her up during the night. I told the patient that it was ok to take extra during the night (also ok'd by [facility]).

## 2011-01-10 ENCOUNTER — Other Ambulatory Visit: Payer: Self-pay

## 2011-01-10 MED ORDER — ALPRAZOLAM 1 MG PO TABS: 1.0000 mg | ORAL_TABLET | Freq: Every day | ORAL | Status: DC

## 2011-01-19 ENCOUNTER — Encounter: Payer: Self-pay | Admitting: Internal Medicine

## 2011-01-19 ENCOUNTER — Ambulatory Visit (INDEPENDENT_AMBULATORY_CARE_PROVIDER_SITE_OTHER): Payer: Medicare Other | Admitting: Internal Medicine

## 2011-01-19 DIAGNOSIS — R634 Abnormal weight loss: Secondary | ICD-10-CM

## 2011-01-19 DIAGNOSIS — R079 Chest pain, unspecified: Secondary | ICD-10-CM | POA: Insufficient documentation

## 2011-01-19 NOTE — Progress Notes (Signed)
Subjective:    Patient ID: Monique Crosby, female    DOB: 05-25-1932, 77 y.o.   MRN: [id number]  HPI This 77 year old white woman developed relatively sudden onset of sharp substernal chest pain spreading across her chest the summer. She was treated with PPI therapy initially daily and then twice daily and Reglan. She's been using a lot of antacids as well. The pain occurs somewhat randomly though may occur after meals and bothers her throughout the day and even awakens her. She does not have any dysphagia, there is no dyspnea, no nausea or vomiting reported. This caused her to change the way she eats, eating lighter and more bland foods. Overall she is somewhat better but denies significant improvement despite the aggressive therapy she's been on. Her weight is down 6 pounds from 122-116 pounds since the summer. Bowel movements are low but slower low but constipated on her new diet but no major change there. She denies having problems like this before. She used to take occasional over-the-counter antacids to provide some relief for occasional heartburn but never like this.  She relevant for using prednisone for seronegative arthritis. Also on Fosamax. These stopped in the summer, about that time her problems began. Her gallbladder has been removed.  Allergies  Allergen Reactions  . Amoxicillin    Outpatient Prescriptions Prior to Visit  Medication Sig Dispense Refill  . ALPRAZolam (XANAX) 1 MG tablet Take 1 tablet (1 mg total) by mouth daily.  30 tablet  5  . buPROPion (WELLBUTRIN XL) 300 MG 24 hr tablet Take 1 tablet (300 mg total) by mouth daily.  90 tablet  6  . pantoprazole (PROTONIX) 40 MG tablet 1 tablet twice daily  180 tablet  6   Past Medical History  Diagnosis Date  . ARTHRITIS, RHEUMATOID 11/29/2006  . BURSITIS, SHOULDER 08/09/2008  . DEPRESSION 02/24/2007  . DIVERTICULOSIS, COLON 02/24/2007  . GERD 02/24/2007  . OSTEOPENIA 11/29/2006  . DCIS (ductal carcinoma in situ)   . External  hemorrhoids   . Breast cancer     left   Past Surgical History  Procedure Date  . Appendectomy   . Abdominal hysterectomy   . Rotator cuff repair 2010    left, Dr. Ophelia Charter  . Dilation and curettage of uterus   . Cholecystectomy   . Colonoscopy 11/13/2002    diverticulosis, external hemorrhoids  . Rectocele repair     and cystocele  . Fatty tumor excision     rigth forearm   History   Social History  . Marital Status: Married    Spouse Name: N/A    Number of Children: 1  . Years of Education: N/A   Occupational History  . retired    Social History Main Topics  . Smoking status: Former Smoker    Quit date: 02/19/1978  . Smokeless tobacco: Never Used  . Alcohol Use: No  . Drug Use: No  .            Social History Narrative   1 cup of coffee daily. Stopped 4 months ago   Family History  Problem Relation Age of Onset  . Lung cancer Mother   . Coronary artery disease Mother   . Prostate cancer Father   . Kidney failure Brother   . Coronary artery disease Brother   . Liver cancer Father   . Thyroid disease Sister         Review of Systems Positive for hand pain, anxiety and insomnia, visual disturbance related  cataracts. Some low back pain All other systems negative or as above.    Objective:   Physical Exam General:  Well-developed, well-nourished and in no acute distress - she is thin Eyes:  anicteric. ENT:   Mouth and posterior pharynx free of lesions. She does have dentures  Neck:   supple w/o thyromegaly or mass.  Lungs: Clear to auscultation bilaterally. The chest wall is nontender Heart:  S1S2, no rubs, murmurs, gallops. Abdomen:  soft, non-tender, no hepatosplenomegaly, hernia, or mass and BS+.   Lymph:  no cervical or supraclavicular adenopathy. Extremities:   no edema there are hard nodular changes to the digits, and the PIP and DIP joints. Skin   no rash. Neuro:  A&O x 3.  Psych:  appropriate mood and  affect.         Assessment &  Plan:  She is having chest pain and weight loss issues. If it were GERD and most likely would've responded completely to the high dose acid suppression. She could not tolerate metoclopramide due to diarrhea. It is interesting that it began after she stopped prednisone and Fosamax, both of which could of been related to a problem. He has some relationship to eating but not always. Her chest wall is nontender. It does not sound cardiac, and EKG was okay in May.  Not certain what is going on. An upper endoscopy I think is indicated. If that is unrevealing then a CT of the chest might be needed. I don't think this is anxiety manifestation but that esophageal spasm or in the differential as well.

## 2011-01-19 NOTE — Assessment & Plan Note (Signed)
Seems related to dietary changes associated with chest pain problems. Await EGD.

## 2011-01-19 NOTE — Patient Instructions (Signed)
You have been scheduled for an Endoscopy with separate instructions given.  

## 2011-01-19 NOTE — Assessment & Plan Note (Signed)
Problems since summer 2012. Etiology unclear. EGD will be first evaluation. He has not responded to current therapy as one would expect.

## 2011-01-24 ENCOUNTER — Ambulatory Visit: Payer: Medicare Other | Admitting: Internal Medicine

## 2011-01-25 ENCOUNTER — Encounter: Payer: Self-pay | Admitting: Internal Medicine

## 2011-01-25 ENCOUNTER — Ambulatory Visit (AMBULATORY_SURGERY_CENTER): Payer: Medicare Other | Admitting: Internal Medicine

## 2011-01-25 DIAGNOSIS — R079 Chest pain, unspecified: Secondary | ICD-10-CM

## 2011-01-25 DIAGNOSIS — R634 Abnormal weight loss: Secondary | ICD-10-CM

## 2011-01-25 HISTORY — PX: UPPER GASTROINTESTINAL ENDOSCOPY: SHX188

## 2011-01-25 MED ORDER — SODIUM CHLORIDE 0.9 % IV SOLN: 500.0000 mL | INTRAVENOUS | Status: DC

## 2011-01-25 NOTE — Patient Instructions (Addendum)
The upper endoscopy exam was normal. I did not identify a cause of her symptoms.  Because of your chest pain and weight loss, I will have my office nurse to arrange for you to have a chest CT scan. We will contact you by phone with those details in the next few days or by next week.  Iva Boop, MD, [facility]   FOLLOW DISCHARGE INSTRUCTIONS (BLUE & GREEN SHEETS).

## 2011-01-25 NOTE — Progress Notes (Signed)
Patient did not experience any of the following events: a burn prior to discharge; a fall within the facility; wrong site/side/patient/procedure/implant event; or a hospital transfer or hospital admission upon discharge from the facility. (G8907) Patient did not have preoperative order for IV antibiotic SSI prophylaxis. (G8918)  

## 2011-01-25 NOTE — Op Note (Signed)
[facility] [address] [place], [place]  [address]  ENDOSCOPY PROCEDURE REPORT  PATIENT:  Monique Crosby, Monique Crosby  MR#:  [id number] BIRTHDATE:  08/22/1932, 78 yrs. old  GENDER:  female  ENDOSCOPIST:  Iva Boop, MD, [facility]  PROCEDURE DATE:  01/25/2011 PROCEDURE:  EGD, diagnostic [phone] ASA CLASS:  Class II INDICATIONS:  chest pain, weight loss  MEDICATIONS:   These medications were titrated to patient response per physician's verbal order, Fentanyl 50 mcg IV, Versed 6 mg IV TOPICAL ANESTHETIC:  Cetacaine Spray  DESCRIPTION OF PROCEDURE:   After the risks benefits and alternatives of the procedure were thoroughly explained, informed consent was obtained.  The [facility] [id number] endoscope was introduced through the mouth and advanced to the second portion of the duodenum, without limitations.  The instrument was slowly withdrawn as the mucosa was fully examined. <<PROCEDUREIMAGES>>  The upper, middle, and distal third of the esophagus were carefully inspected and no abnormalities were noted. The z-line was well seen at the GEJ. The endoscope was pushed into the fundus which was normal including a retroflexed view. The antrum,gastric body, first and second part of the duodenum were unremarkable. There were some retained secretions and medication coating parts of the stomach.    Retroflexed views revealed no abnormalities. The scope was then withdrawn from the patient and the procedure completed.  COMPLICATIONS:  None  ENDOSCOPIC IMPRESSION: 1) Normal EGD RECOMMENDATIONS: Chest CT (no contrast) will be scheduled to evaluate chest pain and weight loss.  Iva Boop, MD, [facility]  CC:  The Patient  n. eSIGNED:   Iva Boop at 01/25/2011 02:40 PM  Josephine Cables, [phone]

## 2011-01-26 ENCOUNTER — Telehealth: Payer: Self-pay | Admitting: *Deleted

## 2011-01-26 NOTE — Telephone Encounter (Signed)

## 2011-01-30 ENCOUNTER — Telehealth: Payer: Self-pay | Admitting: Internal Medicine

## 2011-01-30 NOTE — Telephone Encounter (Signed)
Patient is scheduled for CT scan of the chest without contrast.  She is advised of the appt date and time

## 2011-01-31 ENCOUNTER — Other Ambulatory Visit: Payer: Self-pay

## 2011-01-31 ENCOUNTER — Ambulatory Visit (INDEPENDENT_AMBULATORY_CARE_PROVIDER_SITE_OTHER)
Admission: RE | Admit: 2011-01-31 | Discharge: 2011-01-31 | Disposition: A | Discharge status: Home / Self Care | Payer: Medicare Other | Source: Ambulatory Visit | Attending: Nurse Practitioner | Admitting: Nurse Practitioner

## 2011-01-31 DIAGNOSIS — R079 Chest pain, unspecified: Secondary | ICD-10-CM

## 2011-02-01 ENCOUNTER — Ambulatory Visit (INDEPENDENT_AMBULATORY_CARE_PROVIDER_SITE_OTHER)
Admission: RE | Admit: 2011-02-01 | Discharge: 2011-02-01 | Disposition: A | Discharge status: Home / Self Care | Payer: Medicare Other | Source: Ambulatory Visit | Attending: Internal Medicine | Admitting: Internal Medicine

## 2011-02-01 DIAGNOSIS — R079 Chest pain, unspecified: Secondary | ICD-10-CM

## 2011-02-01 NOTE — Progress Notes (Signed)
Quick Note:  Chest CT does not show any problems that are causing chest pain and weight loss. i think she does not really have GERD as would have expected it to respond to a PPI and/or H2 blocker. Lets have her try carafate 1 gram 4 times a day to see if that makes a difference - # 120 with 1 refill ? If she is having an anxiety issue also ( I know she is worried about sxs but ? If other anxiety).  I think she will need to follow-up with PCP but I can see her in a month also ______

## 2011-02-02 ENCOUNTER — Other Ambulatory Visit: Payer: Self-pay

## 2011-02-02 ENCOUNTER — Telehealth: Payer: Self-pay | Admitting: Internal Medicine

## 2011-02-02 MED ORDER — SUCRALFATE 1 G PO TABS: 1.0000 g | ORAL_TABLET | Freq: Four times a day (QID) | ORAL | Status: DC

## 2011-02-02 NOTE — Telephone Encounter (Signed)
Patient advised see CT result

## 2011-02-02 NOTE — Telephone Encounter (Signed)
Left message for patient to call back  

## 2011-03-13 ENCOUNTER — Encounter: Payer: Self-pay | Admitting: Internal Medicine

## 2011-03-13 ENCOUNTER — Ambulatory Visit (INDEPENDENT_AMBULATORY_CARE_PROVIDER_SITE_OTHER): Payer: Medicare Other | Admitting: Internal Medicine

## 2011-03-13 DIAGNOSIS — R634 Abnormal weight loss: Secondary | ICD-10-CM

## 2011-03-13 DIAGNOSIS — R079 Chest pain, unspecified: Secondary | ICD-10-CM

## 2011-03-13 NOTE — Patient Instructions (Signed)
Glad to hear your symptoms are much better now. Please followup with her primary care physician and see GI as needed, I think your primary care physician can refill the Carafate in the future.

## 2011-03-13 NOTE — Assessment & Plan Note (Signed)
This has resolved. Cause not entirely clear but she is doing well on Carafate. Upper endoscopy and chest CT did not reveal a cause. She will see me as needed.

## 2011-03-13 NOTE — Progress Notes (Signed)
  Subjective:    Patient ID: Monique Crosby, female    DOB: Jan 20, 1933, 78 y.o.   MRN: [id number]  HPI The patient returns for followup of chest pain and weight loss problems. In the interim she thought that her PPI was causing some gas pressure and chest pain. She stopped that going to daily and then none and is on Carafate and says she feels fine. She is asking that I fell out of medical clearance for cataract surgery because she told them she was having chest pain. She has no exertional symptoms this is all been related to eating and again, is not occurring anymore. I filled out the form for her. She has gained a few pounds since last visit. Review of Systems As above    Objective:   Physical Exam Thin elderly woman in no acute distress      Assessment & Plan:

## 2011-03-13 NOTE — Assessment & Plan Note (Signed)
This is improving as her ability to eat has improved.

## 2011-03-20 ENCOUNTER — Other Ambulatory Visit: Payer: Self-pay | Admitting: Internal Medicine

## 2011-03-20 DIAGNOSIS — Z1231 Encounter for screening mammogram for malignant neoplasm of breast: Secondary | ICD-10-CM

## 2011-04-02 ENCOUNTER — Telehealth: Payer: Self-pay

## 2011-04-02 MED ORDER — SUCRALFATE 1 G PO TABS: 1.0000 g | ORAL_TABLET | Freq: Four times a day (QID) | ORAL | Status: DC

## 2011-04-02 NOTE — Telephone Encounter (Signed)
Ok'd carafate to [employer]

## 2011-04-26 ENCOUNTER — Ambulatory Visit
Admission: RE | Admit: 2011-04-26 | Discharge: 2011-04-26 | Disposition: A | Discharge status: Home / Self Care | Payer: Medicare Other | Source: Ambulatory Visit | Attending: Internal Medicine | Admitting: Internal Medicine

## 2011-04-26 DIAGNOSIS — Z1231 Encounter for screening mammogram for malignant neoplasm of breast: Secondary | ICD-10-CM

## 2011-07-02 ENCOUNTER — Encounter: Payer: Self-pay | Admitting: Internal Medicine

## 2011-07-02 ENCOUNTER — Ambulatory Visit (INDEPENDENT_AMBULATORY_CARE_PROVIDER_SITE_OTHER): Payer: Medicare Other | Admitting: Internal Medicine

## 2011-07-02 VITALS — BP 112/70 | HR 68 | Temp 97.6°F | Resp 18 | Ht 64.0 in | Wt 128.0 lb

## 2011-07-02 DIAGNOSIS — E785 Hyperlipidemia, unspecified: Secondary | ICD-10-CM

## 2011-07-02 DIAGNOSIS — M069 Rheumatoid arthritis, unspecified: Secondary | ICD-10-CM

## 2011-07-02 DIAGNOSIS — M899 Disorder of bone, unspecified: Secondary | ICD-10-CM

## 2011-07-02 DIAGNOSIS — M949 Disorder of cartilage, unspecified: Secondary | ICD-10-CM

## 2011-07-02 DIAGNOSIS — Z0000 Encounter for general adult medical examination without abnormal findings: Secondary | ICD-10-CM

## 2011-07-02 DIAGNOSIS — K219 Gastro-esophageal reflux disease without esophagitis: Secondary | ICD-10-CM

## 2011-07-02 LAB — CBC WITH DIFFERENTIAL/PLATELET
Basophils Relative: 0.4 % (ref 0.0–3.0)
Hemoglobin: 12.1 g/dL (ref 12.0–15.0)
Lymphocytes Relative: 27.2 % (ref 12.0–46.0)
Monocytes Relative: 7.6 % (ref 3.0–12.0)
Neutro Abs: 3.3 10*3/uL (ref 1.4–7.7)
RBC: 4.01 Mil/uL (ref 3.87–5.11)
WBC: 5.3 10*3/uL (ref 4.5–10.5)

## 2011-07-02 LAB — COMPREHENSIVE METABOLIC PANEL
BUN: 19 mg/dL (ref 6–23)
CO2: 26 mEq/L (ref 19–32)
Calcium: 9.1 mg/dL (ref 8.4–10.5)
Chloride: 101 mEq/L (ref 96–112)
Creatinine, Ser: 1.1 mg/dL (ref 0.4–1.2)
GFR: 52.88 mL/min — ABNORMAL LOW (ref >60.00)
Glucose, Bld: 75 mg/dL (ref 70–99)

## 2011-07-02 LAB — LIPID PANEL
Cholesterol: 221 mg/dL — ABNORMAL HIGH (ref 0–200)
VLDL: 19.8 mg/dL (ref 0.0–40.0)

## 2011-07-02 LAB — TSH: TSH: 2.63 u[IU]/mL (ref 0.35–5.50)

## 2011-07-02 MED ORDER — BUPROPION HCL ER (XL) 300 MG PO TB24: 300.0000 mg | ORAL_TABLET | Freq: Every day | ORAL | Status: DC

## 2011-07-02 MED ORDER — SUCRALFATE 1 G PO TABS: 1.0000 g | ORAL_TABLET | Freq: Four times a day (QID) | ORAL | Status: DC

## 2011-07-02 MED ORDER — ALPRAZOLAM 1 MG PO TABS: 1.0000 mg | ORAL_TABLET | Freq: Every day | ORAL | Status: DC

## 2011-07-02 NOTE — Progress Notes (Signed)
Subjective:    Patient ID: DEQUITA SCHLEICHER, female    DOB: [birth year], 78 y.o.   MRN: [id number]  HPI  Wt Readings from Last 3 Encounters:  07/02/11 128 lb (58.06 kg)  03/13/11 122 lb (55.339 kg)  01/25/11 116 lb (52.617 kg)     Subjective:    Patient ID: Jacqulyn Cane, female    DOB: 1932-05-05, 78 y.o.   MRN: [id number]  HPI   63 -year-old patient who is seen today for her annual exam. She has self titrated herself off low-dose prednisone therapy. This had been prescribed for seronegative RA. She has a history of osteopenia and has also discontinued Fosamax. She is doing quite well today. Earlier in the year she was treated for a symptomatic gastroesophageal reflux disease. Evaluation included upper panendoscopy as well as a chest CT scan. Her weight continues to improve and now has normalized. She feels well although still takes Carafate for symptom control. Her last colonoscopy was 2004   1. Risk factors, based on past  M,S,F history-  cardiovascular risk factors include a history of mild dyslipidemia  2.  Physical activities:  Remains quite active without exercise limitation  3.  Depression/mood: No history of depression or mood disorder. Does take Xanax infrequently due to anxiety  4.  Hearing: No deficits  5.  ADL's: Independent in all aspects of daily living  6.  Fall risk: Low  7.  Home safety: No problems identified  8.  Height weight, and visual acuity; height and weight stable. Weight is down a few pounds compared to her last exam. No low change in visual acuity  9.  Counseling: Heart healthy diet regular exercise encouraged she is on calcium and vitamin D supplements  10. Lab orders based on risk factors: Laboratory profile will be reviewed  11. Referral : Not appropriate at this time. Has had a recent mammogram  12. Care plan: Heart healthy diet annual mammogram and annual health assessment encouraged  13. Cognitive assessment: Alert and appropriate with normal  affect no cognitive dysfunction     Allergies:  1) ! Amoxicillin (Amoxicillin)   Past History:  Past Medical History:  Reviewed history from 06/17/2008 and no changes required.   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  father history prostate cancer  mother history of lung cancer, coronary artery disease  Four brothers, renal failure, CAD  one sister, obesity DJD   Social History:  Reviewed history from 06/17/2008 and no changes required.  Married  Regular exercise-yes  Former Smoker-d/c 30 yrs ago     [employer] from Last 3 Encounters:  06/29/10 122 lb (55.339 kg)  06/22/09 128 lb (58.06 kg)  08/09/08 133 lb (60.328 kg)      Review of Systems  Constitutional: Negative for fever, appetite change, fatigue and unexpected weight change.  HENT: Negative for hearing loss, ear pain, nosebleeds, congestion, sore throat, mouth sores, trouble swallowing, neck stiffness, dental problem, voice change, sinus pressure and tinnitus.   Eyes: Negative for photophobia, pain, redness and visual disturbance.  Respiratory: Negative for cough, chest tightness and shortness of breath.   Cardiovascular:  Negative for chest pain, palpitations and leg swelling.  Gastrointestinal: Negative for nausea, vomiting, abdominal pain, diarrhea, constipation, blood in stool, abdominal distention and rectal pain.  Genitourinary: Negative for dysuria, urgency, frequency, hematuria, flank pain, vaginal bleeding, vaginal discharge, difficulty urinating, genital sores, vaginal pain, menstrual problem and pelvic pain.  Musculoskeletal:  Negative for back pain and arthralgias.  Skin: Negative for rash.  Neurological: Negative for dizziness, syncope, speech difficulty, weakness, light-headedness, numbness and headaches.  Hematological: Negative for adenopathy. Does not bruise/bleed easily.  Psychiatric/Behavioral: Negative for suicidal ideas, behavioral problems, self-injury, dysphoric mood and agitation. The patient is not nervous/anxious.        Objective:   Physical Exam  Constitutional: She is oriented to person, place, and time. She appears well-developed and well-nourished.  HENT:  Head: Normocephalic and atraumatic.  Right Ear: External ear normal.  Left Ear: External ear normal.  Mouth/Throat: Oropharynx is clear and moist.  Eyes: Conjunctivae and EOM are normal.  Neck: Normal range of motion. Neck supple. No JVD present. No thyromegaly present.  Cardiovascular: Normal rate, regular rhythm, normal heart sounds and intact distal pulses.   No murmur heard. Pulmonary/Chest: Effort normal and breath sounds normal. She has no wheezes. She has no rales.  Abdominal: Soft. Bowel sounds are normal. She exhibits no distension and no mass. There is no tenderness. There is no rebound and no guarding.  Musculoskeletal: Normal range of motion. She exhibits no edema and no tenderness.  Neurological: She is alert and oriented to person, place, and time. She has normal reflexes. No cranial nerve deficit. She exhibits normal muscle tone. Coordination normal.  Skin: Skin is warm and dry. No rash noted.  Psychiatric: She has a normal mood and affect. Her behavior is normal.          Assessment & Plan:   Unremarkable clinical exam Mild dyslipidemia with a high HDL Osteopenia. We'll continue calcium and vitamin D supplements History of seronegative RA. Quiescent  Review of Systems     Objective:   Physical Exam        Assessment & Plan:

## 2011-07-02 NOTE — Patient Instructions (Addendum)
Avoids foods high in acid such as tomatoes citrus juices, and spicy foods.  Avoid eating within two hours of lying down or before exercising.  Do not overheat.  Try smaller more frequent meals.  If symptoms persist, elevate the head of her bed 12 inches while sleeping.  Return in one year for follow-up  Take a calcium supplement, plus [phone] units of vitamin DDiet for GERD or PUD Nutrition therapy can help ease the discomfort of gastroesophageal reflux disease (GERD) and peptic ulcer disease (PUD).   HOME CARE INSTRUCTIONS    Eat your meals slowly, in a relaxed setting.   Eat 5 to 6 small meals per day.   If a food causes distress, stop eating it for a period of time.  FOODS TO AVOID  Coffee, regular or decaffeinated.   Cola beverages, regular or low calorie.   Tea, regular or decaffeinated.   Pepper.   Cocoa.   High fat foods, including meats.   Butter, margarine, hydrogenated oil (trans fats).   Peppermint or spearmint (if you have GERD).   Fruits and vegetables if not tolerated.   Alcohol.   Nicotine (smoking or chewing). This is one of the most potent stimulants to acid production in the gastrointestinal tract.   Any food that seems to aggravate your condition.  If you have questions regarding your diet, ask your caregiver or a registered dietitian. TIPS  Lying flat may make symptoms worse. Keep the head of your bed raised 6 to 9 inches (15 to 23 cm) by using a foam wedge or blocks under the legs of the bed.   Do not lay down until 3 hours after eating a meal.   Daily physical activity may help reduce symptoms.  MAKE SURE YOU:    Understand these instructions.   Will watch your condition.   Will get help right away if you are not doing well or get worse.  Document Released: 02/05/2005 Document Revised: 01/25/2011 Document Reviewed: 12/22/2010 [facility] Patient Information 2012 [place], [place].Gastroesophageal Reflux Disease, Adult Gastroesophageal reflux  disease (GERD) happens when acid from your stomach flows up into the esophagus. When acid comes in contact with the esophagus, the acid causes soreness (inflammation) in the esophagus. Over time, GERD may create small holes (ulcers) in the lining of the esophagus. CAUSES    Increased body weight. This puts pressure on the stomach, making acid rise from the stomach into the esophagus.   Smoking. This increases acid production in the stomach.   Drinking alcohol. This causes decreased pressure in the lower esophageal sphincter (valve or ring of muscle between the esophagus and stomach), allowing acid from the stomach into the esophagus.   Late evening meals and a full stomach. This increases pressure and acid production in the stomach.   A malformed lower esophageal sphincter.  Sometimes, no cause is found. SYMPTOMS    Burning pain in the lower part of the mid-chest behind the breastbone and in the mid-stomach area. This may occur twice a week or more often.   Trouble swallowing.   Sore throat.   Dry cough.   Asthma-like symptoms including chest tightness, shortness of breath, or wheezing.  DIAGNOSIS   Your caregiver may be able to diagnose GERD based on your symptoms. In some cases, X-rays and other tests may be done to check for complications or to check the condition of your stomach and esophagus. TREATMENT   Your caregiver may recommend over-the-counter or prescription medicines to help decrease acid production. Ask your  caregiver before starting or adding any new medicines.   HOME CARE INSTRUCTIONS    Change the factors that you can control. Ask your caregiver for guidance concerning weight loss, quitting smoking, and alcohol consumption.   Avoid foods and drinks that make your symptoms worse, such as:   Caffeine or alcoholic drinks.   Chocolate.   Peppermint or mint flavorings.   Garlic and onions.   Spicy foods.   Citrus fruits, such as oranges, lemons, or limes.    Tomato-based foods such as sauce, chili, salsa, and pizza.   Fried and fatty foods.   Avoid lying down for the 3 hours prior to your bedtime or prior to taking a nap.   Eat small, frequent meals instead of large meals.   Wear loose-fitting clothing. Do not wear anything tight around your waist that causes pressure on your stomach.   Raise the head of your bed 6 to 8 inches with wood blocks to help you sleep. Extra pillows will not help.   Only take over-the-counter or prescription medicines for pain, discomfort, or fever as directed by your caregiver.   Do not take aspirin, ibuprofen, or other nonsteroidal anti-inflammatory drugs (NSAIDs).  SEEK IMMEDIATE MEDICAL CARE IF:    You have pain in your arms, neck, jaw, teeth, or back.   Your pain increases or changes in intensity or duration.   You develop nausea, vomiting, or sweating (diaphoresis).   You develop shortness of breath, or you faint.   Your vomit is green, yellow, black, or looks like coffee grounds or blood.   Your stool is red, bloody, or black.  These symptoms could be signs of other problems, such as heart disease, gastric bleeding, or esophageal bleeding. MAKE SURE YOU:    Understand these instructions.   Will watch your condition.   Will get help right away if you are not doing well or get worse.  Document Released: 11/15/2004 Document Revised: 01/25/2011 Document Reviewed: 08/25/2010 [facility] Patient Information 2012 [place], [place].

## 2011-07-31 ENCOUNTER — Other Ambulatory Visit: Payer: Self-pay | Admitting: Internal Medicine

## 2012-03-13 ENCOUNTER — Other Ambulatory Visit: Payer: Self-pay | Admitting: Internal Medicine

## 2012-03-17 NOTE — Telephone Encounter (Signed)
Pt is out °

## 2012-03-26 ENCOUNTER — Other Ambulatory Visit: Payer: Self-pay | Admitting: Internal Medicine

## 2012-03-26 DIAGNOSIS — Z1231 Encounter for screening mammogram for malignant neoplasm of breast: Secondary | ICD-10-CM

## 2012-04-22 ENCOUNTER — Other Ambulatory Visit: Payer: Self-pay | Admitting: Internal Medicine

## 2012-04-29 ENCOUNTER — Ambulatory Visit
Admission: RE | Admit: 2012-04-29 | Discharge: 2012-04-29 | Disposition: A | Discharge status: Home / Self Care | Payer: Medicare Other | Source: Ambulatory Visit | Attending: Internal Medicine | Admitting: Internal Medicine

## 2012-04-29 DIAGNOSIS — Z1231 Encounter for screening mammogram for malignant neoplasm of breast: Secondary | ICD-10-CM

## 2012-05-14 ENCOUNTER — Other Ambulatory Visit: Payer: Self-pay | Admitting: Internal Medicine

## 2012-07-04 ENCOUNTER — Encounter: Payer: Self-pay | Admitting: Internal Medicine

## 2012-07-04 ENCOUNTER — Ambulatory Visit (INDEPENDENT_AMBULATORY_CARE_PROVIDER_SITE_OTHER): Payer: Medicare Other | Admitting: Internal Medicine

## 2012-07-04 VITALS — BP 140/80 | HR 76 | Temp 97.6°F | Resp 18 | Ht 63.0 in | Wt 131.0 lb

## 2012-07-04 DIAGNOSIS — K5730 Diverticulosis of large intestine without perforation or abscess without bleeding: Secondary | ICD-10-CM

## 2012-07-04 DIAGNOSIS — M069 Rheumatoid arthritis, unspecified: Secondary | ICD-10-CM

## 2012-07-04 DIAGNOSIS — K219 Gastro-esophageal reflux disease without esophagitis: Secondary | ICD-10-CM

## 2012-07-04 DIAGNOSIS — E785 Hyperlipidemia, unspecified: Secondary | ICD-10-CM

## 2012-07-04 DIAGNOSIS — R634 Abnormal weight loss: Secondary | ICD-10-CM

## 2012-07-04 DIAGNOSIS — F329 Major depressive disorder, single episode, unspecified: Secondary | ICD-10-CM

## 2012-07-04 DIAGNOSIS — Z0000 Encounter for general adult medical examination without abnormal findings: Secondary | ICD-10-CM

## 2012-07-04 LAB — COMPREHENSIVE METABOLIC PANEL
Albumin: 4 g/dL (ref 3.5–5.2)
CO2: 28 mEq/L (ref 19–32)
Calcium: 9.3 mg/dL (ref 8.4–10.5)
Chloride: 105 mEq/L (ref 96–112)
GFR: 53.32 mL/min — ABNORMAL LOW (ref >60.00)
Glucose, Bld: 71 mg/dL (ref 70–99)
Sodium: 139 mEq/L (ref 135–145)
Total Bilirubin: 0.6 mg/dL (ref 0.3–1.2)
Total Protein: 7 g/dL (ref 6.0–8.3)

## 2012-07-04 LAB — CBC WITH DIFFERENTIAL/PLATELET
Basophils Absolute: 0 10*3/uL (ref 0.0–0.1)
Eosinophils Relative: 3.4 % (ref 0.0–5.0)
HCT: 34.6 % — ABNORMAL LOW (ref 36.0–46.0)
Lymphocytes Relative: 22.8 % (ref 12.0–46.0)
Monocytes Relative: 7 % (ref 3.0–12.0)
Neutrophils Relative %: 66.3 % (ref 43.0–77.0)
Platelets: 265 10*3/uL (ref 150.0–400.0)
WBC: 5.9 10*3/uL (ref 4.5–10.5)

## 2012-07-04 MED ORDER — ALPRAZOLAM 1 MG PO TABS: ORAL_TABLET | ORAL | Status: DC

## 2012-07-04 MED ORDER — BUPROPION HCL ER (XL) 300 MG PO TB24: 300.0000 mg | ORAL_TABLET | Freq: Every day | ORAL | Status: AC

## 2012-07-04 MED ORDER — PREDNISONE 5 MG PO TABS: 5.0000 mg | ORAL_TABLET | Freq: Every day | ORAL | Status: DC

## 2012-07-04 NOTE — Patient Instructions (Addendum)
It is important that you exercise regularly, at least 20 minutes 3 to 4 times per week.  If you develop chest pain or shortness of breath seek  medical attention.  Take a calcium supplement, plus [phone] units of vitamin D  Return in one year for follow-up  Followup Dr. Dareen Piano  Prednisone 1 tablet every morning

## 2012-07-04 NOTE — Progress Notes (Signed)
Patient ID: Monique Crosby, female   DOB: April 29, 1932, 79 y.o.   MRN: [id number]  Subjective:    Patient ID: Monique Crosby, female    DOB: 22-Jul-1932, 79 y.o.   MRN: [id number]  HPI  Wt Readings from Last 3 Encounters:  07/04/12 131 lb (59.421 kg)  07/02/11 128 lb (58.06 kg)  03/13/11 122 lb (55.339 kg)     Subjective:    Patient ID: Monique Crosby, female    DOB: [birth year], 79 y.o.   MRN: [id number]  HPI   79 year-old patient who is seen today for her annual exam. She has self titrated herself off low-dose prednisone therapy over one year ago. This had been prescribed for seronegative RA. She has a history of osteopenia and has also discontinued Fosamax. For the past several months she has had considerable wrist and hand discomfort. This interferes with sleep. She has considerable early-morning stiffness.  She states that she has no knowledge of her diagnosis of seronegative RA. Earlier last year she was treated for a symptomatic gastroesophageal reflux disease. Evaluation included upper panendoscopy as well as a chest CT scan. Her weight continues to improve and now has normalized. She feels well although still takes Carafate for symptom control. Her last colonoscopy was 2004.     1. Risk factors, based on past  M,S,F history-  cardiovascular risk factors include a history of mild dyslipidemia  2.  Physical activities:  Remains quite active without exercise limitation  3.  Depression/mood: No history of depression or mood disorder. Does take Xanax infrequently due to anxiety  4.  Hearing: No deficits  5.  ADL's: Independent in all aspects of daily living  6.  Fall risk: Low  7.  Home safety: No problems identified  8.  Height weight, and visual acuity; height and weight stable. Weight is down a few pounds compared to her last exam. No low change in visual acuity  9.  Counseling: Heart healthy diet regular exercise encouraged she is on calcium and vitamin D supplements  10. Lab  orders based on risk factors: Laboratory profile will be reviewed  11. Referral : Rheumatology followup. Has had a recent mammogram  12. Care plan: Heart healthy diet annual mammogram and annual health assessment encouraged  13. Cognitive assessment: Alert and appropriate with normal affect no cognitive dysfunction     Allergies:  1) ! 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  gravida one, para one, abortus zero   Past Surgical History:   G1P1A0  Appendectomy age 73  Hysterectomy  left DCIS December 99  colonoscopy 2004  Shoulder surgery (2)  D&C  Cholecystectomy  let shoulder surgery   Family History:   father history prostate cancer  mother history of lung cancer, coronary artery disease  Four brothers, renal failure, CAD  one sister, obesity DJD   Social History:   Married  Regular exercise-yes  Former Smoker-d/c 30 yrs ago     [employer] from Last 3 Encounters:  06/29/10 122 lb (55.339 kg)  06/22/09 128 lb (58.06 kg)  08/09/08 133 lb (60.328 kg)      Review of Systems  Constitutional: Negative for fever, appetite change, fatigue and unexpected weight change.  HENT: Negative for hearing loss, ear pain, nosebleeds, congestion, sore throat, mouth sores, trouble swallowing, neck stiffness, dental problem, voice change, sinus pressure and tinnitus.  Eyes: Negative for photophobia, pain, redness and visual disturbance.  Respiratory: Negative for cough, chest tightness and shortness of breath.   Cardiovascular: Negative for chest pain, palpitations and leg swelling.  Gastrointestinal: Negative for nausea, vomiting, abdominal pain, diarrhea, constipation, blood in stool, abdominal distention and rectal pain.  Genitourinary: Negative for dysuria, urgency, frequency, hematuria, flank pain, vaginal bleeding, vaginal discharge,  difficulty urinating, genital sores, vaginal pain, menstrual problem and pelvic pain.  Musculoskeletal: Negative for back pain and arthralgias.  Skin: Negative for rash.  Neurological: Negative for dizziness, syncope, speech difficulty, weakness, light-headedness, numbness and headaches.  Hematological: Negative for adenopathy. Does not bruise/bleed easily.  Psychiatric/Behavioral: Negative for suicidal ideas, behavioral problems, self-injury, dysphoric mood and agitation. The patient is not nervous/anxious.        Objective:   Physical Exam  Constitutional: She is oriented to person, place, and time. She appears well-developed and well-nourished.  HENT:  Head: Normocephalic and atraumatic.  Right Ear: External ear normal.  Left Ear: External ear normal.  Mouth/Throat: Oropharynx is clear and moist.  Eyes: Conjunctivae and EOM are normal.  Neck: Normal range of motion. Neck supple. No JVD present. No thyromegaly present.  Cardiovascular: Normal rate, regular rhythm, normal heart sounds and intact distal pulses.   No murmur heard. Pulmonary/Chest: Effort normal and breath sounds normal. She has no wheezes. She has no rales.  Abdominal: Soft. Bowel sounds are normal. She exhibits no distension and no mass. There is no tenderness. There is no rebound and no guarding.  Musculoskeletal: Normal range of motion. She exhibits no edema and no tenderness.  Neurological: She is alert and oriented to person, place, and time. She has normal reflexes. No cranial nerve deficit. She exhibits normal muscle tone. Coordination normal.  Skin: Skin is warm and dry. No rash noted.  Psychiatric: She has a normal mood and affect. Her behavior is normal.          Assessment & Plan:   Unremarkable clinical exam Mild dyslipidemia with a high HDL Osteopenia. We'll continue calcium and vitamin D supplements History of seronegative RA. Quiescent  Review of Systems Positive for hand and wrist discomfort  and early-morning stiffness    Objective:   Physical Exam  Neck:  Faint right supraclavicular bruit  Cardiovascular:  The right posterior tibial pulse full ; other pedal pulses faint  Musculoskeletal:  Active synovitis of the second and third MCP joints bilaterally Bilateral wrist tenderness          Assessment & Plan:  Preventive health examination Active seronegative RA. After considerable persuasion the patient has agreed to resume prednisone at a dose of 5 mg daily area and she is very reluctant to continue prednisone. She feels this may be etiologic as far as her chest pain over one year ago. Will discuss with rheumatology other options

## 2012-07-24 ENCOUNTER — Other Ambulatory Visit: Payer: Self-pay | Admitting: Internal Medicine

## 2013-01-17 ENCOUNTER — Other Ambulatory Visit: Payer: Self-pay | Admitting: Internal Medicine

## 2013-02-28 ENCOUNTER — Emergency Department (HOSPITAL_COMMUNITY)
Admission: EM | Admit: 2013-02-28 | Discharge: 2013-02-28 | Disposition: A | Discharge status: Home / Self Care | Payer: Medicare Other | Attending: Emergency Medicine | Admitting: Emergency Medicine

## 2013-02-28 ENCOUNTER — Encounter (HOSPITAL_COMMUNITY): Payer: Self-pay | Admitting: Emergency Medicine

## 2013-02-28 ENCOUNTER — Emergency Department (HOSPITAL_COMMUNITY): Payer: Medicare Other

## 2013-02-28 DIAGNOSIS — W010XXA Fall on same level from slipping, tripping and stumbling without subsequent striking against object, initial encounter: Secondary | ICD-10-CM | POA: Insufficient documentation

## 2013-02-28 DIAGNOSIS — Y9229 Other specified public building as the place of occurrence of the external cause: Secondary | ICD-10-CM | POA: Insufficient documentation

## 2013-02-28 DIAGNOSIS — S065X9A Traumatic subdural hemorrhage with loss of consciousness of unspecified duration, initial encounter: Secondary | ICD-10-CM

## 2013-02-28 DIAGNOSIS — Y9389 Activity, other specified: Secondary | ICD-10-CM | POA: Insufficient documentation

## 2013-02-28 DIAGNOSIS — M069 Rheumatoid arthritis, unspecified: Secondary | ICD-10-CM | POA: Insufficient documentation

## 2013-02-28 DIAGNOSIS — C50919 Malignant neoplasm of unspecified site of unspecified female breast: Secondary | ICD-10-CM | POA: Insufficient documentation

## 2013-02-28 DIAGNOSIS — Z87891 Personal history of nicotine dependence: Secondary | ICD-10-CM | POA: Insufficient documentation

## 2013-02-28 DIAGNOSIS — I6200 Nontraumatic subdural hemorrhage, unspecified: Secondary | ICD-10-CM | POA: Insufficient documentation

## 2013-02-28 DIAGNOSIS — S065XAA Traumatic subdural hemorrhage with loss of consciousness status unknown, initial encounter: Secondary | ICD-10-CM

## 2013-02-28 DIAGNOSIS — K219 Gastro-esophageal reflux disease without esophagitis: Secondary | ICD-10-CM | POA: Insufficient documentation

## 2013-02-28 DIAGNOSIS — F3289 Other specified depressive episodes: Secondary | ICD-10-CM | POA: Insufficient documentation

## 2013-02-28 DIAGNOSIS — F329 Major depressive disorder, single episode, unspecified: Secondary | ICD-10-CM | POA: Insufficient documentation

## 2013-02-28 DIAGNOSIS — Z79899 Other long term (current) drug therapy: Secondary | ICD-10-CM | POA: Insufficient documentation

## 2013-02-28 LAB — PROTIME-INR
INR: 0.96 (ref 0.00–1.49)
Prothrombin Time: 12.6 seconds (ref 11.6–15.2)

## 2013-02-28 LAB — COMPREHENSIVE METABOLIC PANEL
ALBUMIN: 4.1 g/dL (ref 3.5–5.2)
ALK PHOS: 124 U/L — AB (ref 39–117)
ALT: 13 U/L (ref 0–35)
AST: 21 U/L (ref 0–37)
BUN: 22 mg/dL (ref 6–23)
CO2: 22 mEq/L (ref 19–32)
Calcium: 9.6 mg/dL (ref 8.4–10.5)
Chloride: 100 mEq/L (ref 96–112)
Creatinine, Ser: 1.03 mg/dL (ref 0.50–1.10)
GFR calc non Af Amer: 51 mL/min — ABNORMAL LOW (ref >90)
GFR, EST AFRICAN AMERICAN: 59 mL/min — AB (ref >90)
GLUCOSE: 100 mg/dL — AB (ref 70–99)
Potassium: 4.5 mEq/L (ref 3.7–5.3)
Sodium: 136 mEq/L — ABNORMAL LOW (ref 137–147)
TOTAL PROTEIN: 7.4 g/dL (ref 6.0–8.3)
Total Bilirubin: <0.2 mg/dL — ABNORMAL LOW (ref 0.3–1.2)

## 2013-02-28 LAB — APTT: aPTT: 78 seconds — ABNORMAL HIGH (ref 24–37)

## 2013-02-28 MED ORDER — OXYCODONE-ACETAMINOPHEN 5-325 MG PO TABS
1.0000 | ORAL_TABLET | Freq: Once | ORAL | Status: AC
  Administered 2013-02-28: 1 via ORAL
  Filled 2013-02-28: qty 1

## 2013-02-28 MED ORDER — OXYCODONE-ACETAMINOPHEN 5-325 MG PO TABS: 1.0000 | ORAL_TABLET | ORAL | Status: AC | PRN

## 2013-02-28 MED ORDER — ACETAMINOPHEN 325 MG PO TABS
650.0000 mg | ORAL_TABLET | Freq: Once | ORAL | Status: AC
  Administered 2013-02-28: 650 mg via ORAL
  Filled 2013-02-28: qty 2

## 2013-02-28 NOTE — Discharge Instructions (Signed)
Avoid taking aspirin products for the next several days as this can perpetuate bleeding risk. Follow-up with your primary care physician for repeat head CT in a few weeks to ensure resolution of the hematoma. Return to the ED for new or worsening symptoms including unilateral weakness, slurred speech, visual disturbance, ataxic gait, dizziness, severe headache, nausea, vomiting, etc

## 2013-02-28 NOTE — ED Notes (Signed)
Notified PA for pain med, order given.

## 2013-02-28 NOTE — ED Provider Notes (Signed)
Medical screening examination/treatment/procedure(s) were conducted as a shared visit with non-physician practitioner(s) and myself.  I personally evaluated the patient during the encounter.  EKG Interpretation   None       I interviewed and examined the patient. Lungs are CTAB. Cardiac exam wnl. Abdomen soft. Moderate sized hematoma to right forehead. The pt appears well on my exam, is sitting up, in minimal pain, and interactive.   Found to have very small SDH. Discussed w/ [facility] who recommends d/c home. This was discussed w/ the pt as well as reasons to return (weakness, numbness, AMS, worsening HA). She understands and is happy w/ the plan.   Blanchard Kelch, MD 02/28/13 1902

## 2013-02-28 NOTE — ED Notes (Signed)
Pt missed a step at [employer], fell on concrete hitting rt side of head above eye. Large hematoma noted, denies LOC or other injuries. Ice pack applied in triage

## 2013-02-28 NOTE — ED Provider Notes (Signed)
CSN: [id number]     Arrival date & time 02/28/13  1228 History   First MD Initiated Contact with Patient 02/28/13 1246     Chief Complaint  Patient presents with  . Fall  . Head Injury   (Consider location/radiation/quality/duration/timing/severity/associated sxs/prior Treatment) The history is provided by the patient and medical records.   This is a 80 year old female with past medical history significant for depression, GERD, rheumatoid arthritis, presenting to the ED for head injury prior to arrival. Patient was walking around in [employer] [facility] [employer], missed a step and fell hitting the right side of her head on concrete surface. She denies any loss of consciousness. Patient only complains of right-sided headache at location of the injury, she does have a large hematoma present. Patient denies any dizziness, weakness, confusion, visual disturbance, tinnitus, changes in speech, or gait disturbance. Patient is not currently on any anticoagulants. No prior head injuries.  Denies any other injuries from fall.  VS stable on arrival.  Past Medical History  Diagnosis Date  . ARTHRITIS, RHEUMATOID 11/29/2006  . BURSITIS, SHOULDER 08/09/2008  . DEPRESSION 02/24/2007  . DIVERTICULOSIS, COLON 02/24/2007  . GERD 02/24/2007  . OSTEOPENIA 11/29/2006  . DCIS (ductal carcinoma in situ)   . External hemorrhoids   . Breast cancer     left   Past Surgical History  Procedure Laterality Date  . Appendectomy    . Abdominal hysterectomy    . Rotator cuff repair  2010    left, Dr. Lorin Mercy  . Dilation and curettage of uterus    . Cholecystectomy    . Colonoscopy  11/13/2002    diverticulosis, external hemorrhoids  . Rectocele repair      and cystocele  . Fatty tumor excision      rigth forearm  . Upper gastrointestinal endoscopy  01/25/2011    Normal  . Cataract extraction      righ and left   Family History  Problem Relation Age of Onset  . Lung cancer Mother   . Coronary artery disease Mother     . Prostate cancer Father   . Kidney failure Brother   . Coronary artery disease Brother   . Liver cancer Father   . Thyroid disease Sister    History  Substance Use Topics  . Smoking status: Former Smoker    Quit date: 02/19/1978  . Smokeless tobacco: Never Used  . Alcohol Use: No   OB History   Grav Para Term Preterm Abortions TAB SAB Ect Mult Living                 Review of Systems  Neurological: Positive for headaches.       Head injury  All other systems reviewed and are negative.    Allergies  Amoxicillin and Reglan  Home Medications   Current Outpatient Rx  Name  Route  Sig  Dispense  Refill  . acetaminophen (TYLENOL) 500 MG tablet   Oral   Take 500 mg by mouth every 6 (six) hours as needed for moderate pain.         Marland Kitchen ALPRAZolam (XANAX) 1 MG tablet      TAKE 1 TABLET EVERY DAY as needed   30 tablet   2   . buPROPion (WELLBUTRIN XL) 300 MG 24 hr tablet   Oral   Take 1 tablet (300 mg total) by mouth daily.   90 tablet   6   . Lysine HCl 500 MG TABS   Oral  Take 1 tablet by mouth daily as needed.          . Magnesium 400 MG CAPS   Oral   Take 1 tablet by mouth at bedtime as needed (for sleep).          . sucralfate (CARAFATE) 1 G tablet      TAKE 1 TABLET BY MOUTH 4 TIMES A DAY   360 tablet   1    BP 174/92  Pulse 88  Temp(Src) 97.5 F (36.4 C) (Oral)  Resp 16  SpO2 98%  Physical Exam  Nursing note and vitals reviewed. Constitutional: She is oriented to person, place, and time. She appears well-developed and well-nourished. No distress.  HENT:  Head: Normocephalic. Head is with abrasion and with contusion. Head is without raccoon's eyes, without Battle's sign and without laceration.    Mouth/Throat: Oropharynx is clear and moist.  Large right temporal hematoma with associated bruising; small abrasion present without active bleeding; TTP along right superior orbital rim  Eyes: Conjunctivae, EOM and lids are normal. Pupils are  equal, round, and reactive to light. Right conjunctiva is not injected. Left conjunctiva is not injected. Right eye exhibits normal extraocular motion. Left eye exhibits normal extraocular motion.  EOM fully intact bilaterally; no nystagmus; PERRL  Neck: Normal range of motion.  Cardiovascular: Normal rate, regular rhythm and normal heart sounds.   Pulmonary/Chest: Effort normal and breath sounds normal. No respiratory distress. She has no wheezes.  Musculoskeletal: Normal range of motion.       Cervical back: Normal.  CS exam WNL-- no TTP or deformity, full ROM maintained without pain  Neurological: She is alert and oriented to person, place, and time. She has normal strength. She displays no tremor. No cranial nerve deficit or sensory deficit. She displays no seizure activity. Gait normal.  AAOx3, answering questions appropriately; equal strength UE and LE bilaterally; CN grossly intact; moves all extremities appropriately without ataxia; no pronator drift; no focal neuro deficits or facial asymmetry appreciated  Skin: Skin is warm and dry. She is not diaphoretic.  Psychiatric: She has a normal mood and affect.    ED Course  Procedures (including critical care time) Labs Review Labs Reviewed - No data to display Imaging Review Ct Head Wo Contrast  02/28/2013   CLINICAL DATA:  80 year old female with head and face injury following fall. Headache and facial pain/swelling.  EXAM: CT HEAD WITHOUT CONTRAST  CT MAXILLOFACIAL WITHOUT CONTRAST  TECHNIQUE: Multidetector CT imaging of the head and maxillofacial structures were performed using the standard protocol without intravenous contrast. Multiplanar CT image reconstructions of the maxillofacial structures were also generated.  COMPARISON:  None.  FINDINGS: CT HEAD FINDINGS  A very small right frontoparietal subdural hematoma is noted with maximal diameter of 2 mm. There is no evidence of midline shift, hydrocephalus,intraparenchymal hemorrhage,  mass lesion or mass effect, or infarct.  A moderate to large right scalp/forehead hematoma is noted. The bony calvarium is unremarkable without fracture.  CT MAXILLOFACIAL FINDINGS  A moderate to large right upper facial/forehead hematoma is noted  There is no evidence of acute fracture, subluxation or dislocation.  The orbits and globes are unremarkable.  The paranasal sinuses, mastoid air cells and middle/ inner ears are clear.  No focal bony lesions are present.  Moderate to severe degenerative changes within the visualized cervical spine noted.  IMPRESSION: Very small 2 mm right frontoparietal subdural hematoma without midline shift or mass effect.  Moderate to large right upper facial/forehead hematoma  without acute bony abnormality.  Critical Value/emergent results were called by telephone at the time of interpretation on 02/28/2013 at 2:44 PM to [facility] , who verbally acknowledged these results.   Electronically Signed   By: Hassan Rowan M.D.   On: 02/28/2013 14:47   Ct Maxillofacial Wo Cm  02/28/2013   CLINICAL DATA:  80 year old female with head and face injury following fall. Headache and facial pain/swelling.  EXAM: CT HEAD WITHOUT CONTRAST  CT MAXILLOFACIAL WITHOUT CONTRAST  TECHNIQUE: Multidetector CT imaging of the head and maxillofacial structures were performed using the standard protocol without intravenous contrast. Multiplanar CT image reconstructions of the maxillofacial structures were also generated.  COMPARISON:  None.  FINDINGS: CT HEAD FINDINGS  A very small right frontoparietal subdural hematoma is noted with maximal diameter of 2 mm. There is no evidence of midline shift, hydrocephalus,intraparenchymal hemorrhage, mass lesion or mass effect, or infarct.  A moderate to large right scalp/forehead hematoma is noted. The bony calvarium is unremarkable without fracture.  CT MAXILLOFACIAL FINDINGS  A moderate to large right upper facial/forehead hematoma is noted  There is no evidence of  acute fracture, subluxation or dislocation.  The orbits and globes are unremarkable.  The paranasal sinuses, mastoid air cells and middle/ inner ears are clear.  No focal bony lesions are present.  Moderate to severe degenerative changes within the visualized cervical spine noted.  IMPRESSION: Very small 2 mm right frontoparietal subdural hematoma without midline shift or mass effect.  Moderate to large right upper facial/forehead hematoma without acute bony abnormality.  Critical Value/emergent results were called by telephone at the time of interpretation on 02/28/2013 at 2:44 PM to [facility] , who verbally acknowledged these results.   Electronically Signed   By: Hassan Rowan M.D.   On: 02/28/2013 14:47    EKG Interpretation   None       MDM   1. Subdural hematoma     Slip and fall earlier today with head trauma against concrete surface, no anti-coagulant use.  Neuro exam without focal neuro deficits.  Will obtain screening CT head and max/face.  3:23 PM Case was discussed with neurosurgery, Dr. Luiz Ochoa, who has reviewed CT scan-- advised that since been is miniscule pt may be discharged home as she is not currently on any anti-coagulants, no focal neuro deficits in the ED, and has someone at home to monitor her-- recommends FU with PCP for repeat scan in a few weeks to ensure resolution of hematoma.    I have discussed this with pt, she is comfortable with this plan of care.  Neuro exam remains WNL.  Scalp hematoma seems to be improving after applying ice.  Signs/sx that would warrant return to the ED including unilateral weakness, slurred speech, visual disturbance, ataxic gait, dizziness, severe headache, nausea, vomiting, etc were discussed-- pt and husband acknowledged understanding and agreed.  Discussed case with Dr. Aline Brochure who personally evaluated pt and agrees with plan of care.  Larene Pickett, PA-C 02/28/13 1554

## 2013-03-26 ENCOUNTER — Other Ambulatory Visit: Payer: Self-pay

## 2013-03-26 DIAGNOSIS — Z1231 Encounter for screening mammogram for malignant neoplasm of breast: Secondary | ICD-10-CM

## 2013-03-26 DIAGNOSIS — Z9889 Other specified postprocedural states: Secondary | ICD-10-CM

## 2013-03-26 DIAGNOSIS — Z853 Personal history of malignant neoplasm of breast: Secondary | ICD-10-CM

## 2013-04-30 ENCOUNTER — Ambulatory Visit
Admission: RE | Admit: 2013-04-30 | Discharge: 2013-04-30 | Disposition: A | Discharge status: Home / Self Care | Payer: Medicare Other | Source: Ambulatory Visit

## 2013-04-30 DIAGNOSIS — Z853 Personal history of malignant neoplasm of breast: Secondary | ICD-10-CM

## 2013-04-30 DIAGNOSIS — Z1231 Encounter for screening mammogram for malignant neoplasm of breast: Secondary | ICD-10-CM

## 2013-04-30 DIAGNOSIS — Z9889 Other specified postprocedural states: Secondary | ICD-10-CM

## 2014-04-08 ENCOUNTER — Other Ambulatory Visit: Payer: Self-pay

## 2014-04-08 DIAGNOSIS — Z1231 Encounter for screening mammogram for malignant neoplasm of breast: Secondary | ICD-10-CM

## 2014-05-03 ENCOUNTER — Ambulatory Visit
Admission: RE | Admit: 2014-05-03 | Discharge: 2014-05-03 | Disposition: A | Discharge status: Home / Self Care | Payer: Medicare Other | Source: Ambulatory Visit

## 2014-05-03 DIAGNOSIS — Z1231 Encounter for screening mammogram for malignant neoplasm of breast: Secondary | ICD-10-CM

## 2015-03-30 ENCOUNTER — Other Ambulatory Visit: Payer: Self-pay

## 2015-03-30 DIAGNOSIS — Z1231 Encounter for screening mammogram for malignant neoplasm of breast: Secondary | ICD-10-CM

## 2015-05-05 ENCOUNTER — Ambulatory Visit
Admission: RE | Admit: 2015-05-05 | Discharge: 2015-05-05 | Disposition: A | Discharge status: Home / Self Care | Payer: Medicare Other | Source: Ambulatory Visit

## 2015-05-05 DIAGNOSIS — Z1231 Encounter for screening mammogram for malignant neoplasm of breast: Secondary | ICD-10-CM

## 2015-05-06 ENCOUNTER — Other Ambulatory Visit: Payer: Self-pay | Admitting: Family Medicine

## 2015-05-06 DIAGNOSIS — R928 Other abnormal and inconclusive findings on diagnostic imaging of breast: Secondary | ICD-10-CM

## 2015-05-16 ENCOUNTER — Ambulatory Visit
Admission: RE | Admit: 2015-05-16 | Discharge: 2015-05-16 | Disposition: A | Discharge status: Home / Self Care | Payer: Medicare Other | Source: Ambulatory Visit | Attending: Family Medicine | Admitting: Family Medicine

## 2015-05-16 DIAGNOSIS — R928 Other abnormal and inconclusive findings on diagnostic imaging of breast: Secondary | ICD-10-CM

## 2015-10-10 ENCOUNTER — Other Ambulatory Visit: Payer: Self-pay | Admitting: Family Medicine

## 2015-10-10 DIAGNOSIS — N63 Unspecified lump in unspecified breast: Secondary | ICD-10-CM

## 2015-11-14 ENCOUNTER — Ambulatory Visit
Admission: RE | Admit: 2015-11-14 | Discharge: 2015-11-14 | Disposition: A | Discharge status: Home / Self Care | Payer: Medicare Other | Source: Ambulatory Visit | Attending: Family Medicine | Admitting: Family Medicine

## 2015-11-14 DIAGNOSIS — N63 Unspecified lump in unspecified breast: Secondary | ICD-10-CM

## 2016-05-09 ENCOUNTER — Other Ambulatory Visit: Payer: Self-pay | Admitting: Family Medicine

## 2016-05-09 DIAGNOSIS — Z1231 Encounter for screening mammogram for malignant neoplasm of breast: Secondary | ICD-10-CM

## 2016-05-09 DIAGNOSIS — Z853 Personal history of malignant neoplasm of breast: Secondary | ICD-10-CM

## 2016-05-31 ENCOUNTER — Ambulatory Visit
Admission: RE | Admit: 2016-05-31 | Discharge: 2016-05-31 | Disposition: A | Discharge status: Home / Self Care | Payer: Medicare Other | Source: Ambulatory Visit | Attending: Family Medicine | Admitting: Family Medicine

## 2016-05-31 DIAGNOSIS — Z853 Personal history of malignant neoplasm of breast: Secondary | ICD-10-CM

## 2016-05-31 DIAGNOSIS — Z1231 Encounter for screening mammogram for malignant neoplasm of breast: Secondary | ICD-10-CM

## 2016-05-31 HISTORY — DX: Personal history of irradiation: Z92.3

## 2017-05-16 ENCOUNTER — Other Ambulatory Visit: Payer: Self-pay | Admitting: Family Medicine

## 2017-05-16 DIAGNOSIS — Z1231 Encounter for screening mammogram for malignant neoplasm of breast: Secondary | ICD-10-CM

## 2017-06-06 ENCOUNTER — Other Ambulatory Visit (HOSPITAL_COMMUNITY): Payer: Self-pay | Admitting: Family Medicine

## 2017-06-06 DIAGNOSIS — R011 Cardiac murmur, unspecified: Secondary | ICD-10-CM

## 2017-06-07 ENCOUNTER — Ambulatory Visit (HOSPITAL_COMMUNITY): Payer: Medicare Other | Attending: Cardiovascular Disease

## 2017-06-07 ENCOUNTER — Other Ambulatory Visit: Payer: Self-pay

## 2017-06-07 DIAGNOSIS — E785 Hyperlipidemia, unspecified: Secondary | ICD-10-CM | POA: Insufficient documentation

## 2017-06-07 DIAGNOSIS — R011 Cardiac murmur, unspecified: Secondary | ICD-10-CM | POA: Insufficient documentation

## 2017-06-07 DIAGNOSIS — Z87891 Personal history of nicotine dependence: Secondary | ICD-10-CM | POA: Diagnosis not present

## 2017-06-11 ENCOUNTER — Other Ambulatory Visit: Payer: Self-pay

## 2017-06-11 ENCOUNTER — Inpatient Hospital Stay (HOSPITAL_COMMUNITY)
Admission: EM | Admit: 2017-06-11 | Discharge: 2017-06-19 | DRG: 640 | Disposition: A | Discharge status: Home / Self Care | Payer: Medicare Other | Attending: Internal Medicine | Admitting: Internal Medicine

## 2017-06-11 ENCOUNTER — Encounter (HOSPITAL_COMMUNITY): Payer: Self-pay | Admitting: Emergency Medicine

## 2017-06-11 DIAGNOSIS — Z853 Personal history of malignant neoplasm of breast: Secondary | ICD-10-CM

## 2017-06-11 DIAGNOSIS — E869 Volume depletion, unspecified: Secondary | ICD-10-CM | POA: Diagnosis present

## 2017-06-11 DIAGNOSIS — C259 Malignant neoplasm of pancreas, unspecified: Secondary | ICD-10-CM | POA: Diagnosis present

## 2017-06-11 DIAGNOSIS — Z66 Do not resuscitate: Secondary | ICD-10-CM | POA: Diagnosis present

## 2017-06-11 DIAGNOSIS — K869 Disease of pancreas, unspecified: Secondary | ICD-10-CM | POA: Diagnosis present

## 2017-06-11 DIAGNOSIS — Z9842 Cataract extraction status, left eye: Secondary | ICD-10-CM

## 2017-06-11 DIAGNOSIS — G9341 Metabolic encephalopathy: Secondary | ICD-10-CM | POA: Diagnosis present

## 2017-06-11 DIAGNOSIS — F419 Anxiety disorder, unspecified: Secondary | ICD-10-CM | POA: Diagnosis present

## 2017-06-11 DIAGNOSIS — N390 Urinary tract infection, site not specified: Secondary | ICD-10-CM | POA: Diagnosis present

## 2017-06-11 DIAGNOSIS — Z79899 Other long term (current) drug therapy: Secondary | ICD-10-CM

## 2017-06-11 DIAGNOSIS — R590 Localized enlarged lymph nodes: Secondary | ICD-10-CM | POA: Diagnosis present

## 2017-06-11 DIAGNOSIS — D638 Anemia in other chronic diseases classified elsewhere: Secondary | ICD-10-CM | POA: Diagnosis present

## 2017-06-11 DIAGNOSIS — E876 Hypokalemia: Secondary | ICD-10-CM | POA: Diagnosis present

## 2017-06-11 DIAGNOSIS — I10 Essential (primary) hypertension: Secondary | ICD-10-CM | POA: Diagnosis present

## 2017-06-11 DIAGNOSIS — N179 Acute kidney failure, unspecified: Secondary | ICD-10-CM | POA: Diagnosis present

## 2017-06-11 DIAGNOSIS — E860 Dehydration: Secondary | ICD-10-CM | POA: Diagnosis present

## 2017-06-11 DIAGNOSIS — Z87891 Personal history of nicotine dependence: Secondary | ICD-10-CM

## 2017-06-11 DIAGNOSIS — D649 Anemia, unspecified: Secondary | ICD-10-CM | POA: Diagnosis present

## 2017-06-11 DIAGNOSIS — Z8249 Family history of ischemic heart disease and other diseases of the circulatory system: Secondary | ICD-10-CM

## 2017-06-11 DIAGNOSIS — Z9841 Cataract extraction status, right eye: Secondary | ICD-10-CM

## 2017-06-11 DIAGNOSIS — R591 Generalized enlarged lymph nodes: Secondary | ICD-10-CM

## 2017-06-11 DIAGNOSIS — K831 Obstruction of bile duct: Secondary | ICD-10-CM

## 2017-06-11 DIAGNOSIS — Z923 Personal history of irradiation: Secondary | ICD-10-CM

## 2017-06-11 DIAGNOSIS — Z515 Encounter for palliative care: Secondary | ICD-10-CM | POA: Diagnosis present

## 2017-06-11 DIAGNOSIS — R1909 Other intra-abdominal and pelvic swelling, mass and lump: Secondary | ICD-10-CM | POA: Diagnosis present

## 2017-06-11 DIAGNOSIS — Z90710 Acquired absence of both cervix and uterus: Secondary | ICD-10-CM

## 2017-06-11 DIAGNOSIS — I739 Peripheral vascular disease, unspecified: Secondary | ICD-10-CM | POA: Diagnosis present

## 2017-06-11 DIAGNOSIS — R599 Enlarged lymph nodes, unspecified: Secondary | ICD-10-CM | POA: Diagnosis present

## 2017-06-11 DIAGNOSIS — F329 Major depressive disorder, single episode, unspecified: Secondary | ICD-10-CM | POA: Diagnosis present

## 2017-06-11 DIAGNOSIS — K8689 Other specified diseases of pancreas: Secondary | ICD-10-CM

## 2017-06-11 DIAGNOSIS — E871 Hypo-osmolality and hyponatremia: Secondary | ICD-10-CM | POA: Diagnosis present

## 2017-06-11 DIAGNOSIS — Z888 Allergy status to other drugs, medicaments and biological substances status: Secondary | ICD-10-CM

## 2017-06-11 DIAGNOSIS — E8770 Fluid overload, unspecified: Secondary | ICD-10-CM | POA: Diagnosis not present

## 2017-06-11 DIAGNOSIS — K219 Gastro-esophageal reflux disease without esophagitis: Secondary | ICD-10-CM | POA: Diagnosis present

## 2017-06-11 DIAGNOSIS — J9600 Acute respiratory failure, unspecified whether with hypoxia or hypercapnia: Secondary | ICD-10-CM

## 2017-06-11 DIAGNOSIS — M069 Rheumatoid arthritis, unspecified: Secondary | ICD-10-CM | POA: Diagnosis present

## 2017-06-11 DIAGNOSIS — Z881 Allergy status to other antibiotic agents status: Secondary | ICD-10-CM

## 2017-06-11 DIAGNOSIS — M8580 Other specified disorders of bone density and structure, unspecified site: Secondary | ICD-10-CM | POA: Diagnosis present

## 2017-06-11 DIAGNOSIS — R945 Abnormal results of liver function studies: Secondary | ICD-10-CM

## 2017-06-11 DIAGNOSIS — Z880 Allergy status to penicillin: Secondary | ICD-10-CM

## 2017-06-11 DIAGNOSIS — C7951 Secondary malignant neoplasm of bone: Secondary | ICD-10-CM | POA: Diagnosis present

## 2017-06-11 DIAGNOSIS — K5790 Diverticulosis of intestine, part unspecified, without perforation or abscess without bleeding: Secondary | ICD-10-CM | POA: Diagnosis present

## 2017-06-11 HISTORY — DX: Hypercalcemia: E83.52

## 2017-06-11 LAB — CBC WITH DIFFERENTIAL/PLATELET
Basophils Absolute: 0 10*3/uL (ref 0.0–0.1)
Basophils Relative: 0 %
Eosinophils Absolute: 0 10*3/uL (ref 0.0–0.7)
Eosinophils Relative: 1 %
HCT: 33.1 % — ABNORMAL LOW (ref 36.0–46.0)
Hemoglobin: 10.9 g/dL — ABNORMAL LOW (ref 12.0–15.0)
Lymphocytes Relative: 15 %
Lymphs Abs: 0.7 10*3/uL (ref 0.7–4.0)
MCH: 29.1 pg (ref 26.0–34.0)
MCHC: 32.9 g/dL (ref 30.0–36.0)
MCV: 88.3 fL (ref 78.0–100.0)
MONOS PCT: 9 %
Monocytes Absolute: 0.4 10*3/uL (ref 0.1–1.0)
NEUTROS PCT: 75 %
Neutro Abs: 3.7 10*3/uL (ref 1.7–7.7)
Platelets: 324 10*3/uL (ref 150–400)
RBC: 3.75 MIL/uL — ABNORMAL LOW (ref 3.87–5.11)
RDW: 13.5 % (ref 11.5–15.5)
WBC: 4.9 10*3/uL (ref 4.0–10.5)

## 2017-06-11 LAB — COMPREHENSIVE METABOLIC PANEL
ALT: 28 U/L (ref 14–54)
ANION GAP: 12 (ref 5–15)
AST: 74 U/L — ABNORMAL HIGH (ref 15–41)
Albumin: 4.1 g/dL (ref 3.5–5.0)
Alkaline Phosphatase: 93 U/L (ref 38–126)
BILIRUBIN TOTAL: 0.5 mg/dL (ref 0.3–1.2)
BUN: 26 mg/dL — ABNORMAL HIGH (ref 6–20)
CO2: 26 mmol/L (ref 22–32)
Calcium: >15 mg/dL (ref 8.9–10.3)
Chloride: 94 mmol/L — ABNORMAL LOW (ref 101–111)
Creatinine, Ser: 2.05 mg/dL — ABNORMAL HIGH (ref 0.44–1.00)
GFR calc Af Amer: 25 mL/min — ABNORMAL LOW (ref >60)
GFR calc non Af Amer: 21 mL/min — ABNORMAL LOW (ref >60)
Glucose, Bld: 95 mg/dL (ref 65–99)
Potassium: 4.1 mmol/L (ref 3.5–5.1)
SODIUM: 132 mmol/L — AB (ref 135–145)
TOTAL PROTEIN: 7.6 g/dL (ref 6.5–8.1)

## 2017-06-11 MED ORDER — SODIUM CHLORIDE 0.9 % IV SOLN
INTRAVENOUS | Status: DC
  Administered 2017-06-11: 23:00:00 via INTRAVENOUS

## 2017-06-11 MED ORDER — SODIUM CHLORIDE 0.9 % IV BOLUS
1000.0000 mL | Freq: Once | INTRAVENOUS | Status: AC
  Administered 2017-06-12: 1000 mL via INTRAVENOUS

## 2017-06-11 NOTE — ED Provider Notes (Signed)
[facility] DEPT Provider Note   CSN: [id number] Arrival date & time: 06/11/17  2004     History   Chief Complaint Chief Complaint  Patient presents with  . abnormal labs    HPI Monique Crosby is a 84 y.o. female.  The history is provided by the patient and medical records.    84 year old female with history of breast cancer, depression, diverticulosis, GERD, osteopenia, presenting to the ED for abnormal labs.  Per family, patient had routine lab draw last week at PCP office.  Her calcium was elevated.  She returned today for repeat labs this afternoon that her calcium was still elevated at 14.68.  She was told to come to the ED immediately.  Family reports over the weekend patient was having some trouble with her memory and some mild confusion.  She was found to have UTI at PCP today.  She was started on antibiotics and has had 1 dose she did have a little bit of diarrhea today as well, 2 episodes that have been non-bloody.  Denies abdominal pain-- mild cramping but nothing severe.  No hx of abnormal calcium values in the past to her knowledge.  Past Medical History:  Diagnosis Date  . ARTHRITIS, RHEUMATOID 11/29/2006  . Breast cancer ([place])    left  . BURSITIS, SHOULDER 08/09/2008  . DCIS (ductal carcinoma in situ)   . DEPRESSION 02/24/2007  . DIVERTICULOSIS, COLON 02/24/2007  . External hemorrhoids   . GERD 02/24/2007  . OSTEOPENIA 11/29/2006  . Personal history of radiation therapy 02/1998    Patient Active Problem List   Diagnosis Date Noted  . Chest pain 01/19/2011  . Weight loss 01/19/2011  . BURSITIS, SHOULDER 08/09/2008  . HYPERLIPIDEMIA 02/24/2007  . DEPRESSION 02/24/2007  . GERD 02/24/2007  . DIVERTICULOSIS, COLON 02/24/2007  . ARTHRITIS, RHEUMATOID 11/29/2006  . OSTEOPENIA 11/29/2006    Past Surgical History:  Procedure Laterality Date  . ABDOMINAL HYSTERECTOMY    . APPENDECTOMY    . BREAST BIOPSY Left 01/03/1998   malignant  .  BREAST LUMPECTOMY Left 01/1998  . CATARACT EXTRACTION     righ and left  . CHOLECYSTECTOMY    . COLONOSCOPY  11/13/2002   diverticulosis, external hemorrhoids  . DILATION AND CURETTAGE OF UTERUS    . Fatty Tumor Excision     rigth forearm  . RECTOCELE REPAIR     and cystocele  . ROTATOR CUFF REPAIR  2010   left, Dr. Lorin Mercy  . UPPER GASTROINTESTINAL ENDOSCOPY  01/25/2011   Normal     OB History   None      Home Medications    Prior to Admission medications   Medication Sig Start Date End Date Taking? Authorizing Provider  buPROPion (WELLBUTRIN XL) 300 MG 24 hr tablet Take 1 tablet (300 mg total) by mouth daily. 07/04/12  Yes Marletta Lor, MD  cephALEXin (KEFLEX) 500 MG capsule Take 500 mg by mouth 2 (two) times daily.  06/11/17  Yes [provider]  losartan (COZAAR) 100 MG tablet Take 100 mg by mouth daily.  06/03/17  Yes [provider]  sucralfate (CARAFATE) 1 G tablet TAKE 1 TABLET BY MOUTH 4 TIMES A DAY 01/17/13  Yes Marletta Lor, MD  ALPRAZolam Duanne Moron) 1 MG tablet TAKE 1 TABLET EVERY DAY as needed Patient not taking: Reported on 06/11/2017 07/04/12   Marletta Lor, MD  oxyCODONE-acetaminophen (PERCOCET/ROXICET) 5-325 MG per tablet Take 1 tablet by mouth every 4 (four) hours  as needed. Patient not taking: Reported on 06/11/2017 02/28/13   Larene Pickett, PA-C    Family History Family History  Problem Relation Age of Onset  . Lung cancer Mother   . Coronary artery disease Mother   . Prostate cancer Father   . Liver cancer Father   . Kidney failure Brother   . Coronary artery disease Brother   . Thyroid disease Sister     Social History Social History   Tobacco Use  . Smoking status: Former Smoker    Last attempt to quit: 02/19/1978    Years since quitting: 39.3  . Smokeless tobacco: Never Used  Substance Use Topics  . Alcohol use: No  . Drug use: No     Allergies   Amoxicillin and Reglan [metoclopramide]   Review of  Systems Review of Systems  Constitutional:       Abnormal labs  All other systems reviewed and are negative.    Physical Exam Updated Vital Signs BP (!) 175/81   Pulse 81   Temp 97.7 F (36.5 C) (Oral)   Resp 18   SpO2 99%   Physical Exam  Constitutional: She is oriented to person, place, and time. She appears well-developed and well-nourished.  HENT:  Head: Normocephalic and atraumatic.  Mouth/Throat: Oropharynx is clear and moist.  Eyes: Pupils are equal, round, and reactive to light. Conjunctivae and EOM are normal.  Neck: Normal range of motion.  Cardiovascular: Normal rate, regular rhythm and normal heart sounds.  Pulmonary/Chest: Effort normal and breath sounds normal. No stridor. No respiratory distress.  Abdominal: Soft. Bowel sounds are normal. There is no tenderness. There is no rebound.  Musculoskeletal: Normal range of motion.  Neurological: She is alert and oriented to person, place, and time.  Skin: Skin is warm and dry.  Psychiatric: She has a normal mood and affect.  Nursing note and vitals reviewed.    ED Treatments / Results  Labs (all labs ordered are listed, but only abnormal results are displayed) Labs Reviewed  CBC WITH DIFFERENTIAL/PLATELET - Abnormal; Notable for the following components:      Result Value   RBC 3.75 (*)    Hemoglobin 10.9 (*)    HCT 33.1 (*)    All other components within normal limits  COMPREHENSIVE METABOLIC PANEL - Abnormal; Notable for the following components:   Sodium 132 (*)    Chloride 94 (*)    BUN 26 (*)    Creatinine, Ser 2.05 (*)    Calcium >15.0 (*)    AST 74 (*)    GFR calc non Af Amer 21 (*)    GFR calc Af Amer 25 (*)    All other components within normal limits  URINE CULTURE  CALCIUM, IONIZED  PARATHYROID HORMONE, INTACT (NO CA)  URINALYSIS, ROUTINE W REFLEX MICROSCOPIC    EKG None  Radiology No results found.  Procedures Procedures (including critical care time)  Medications Ordered in  ED Medications  0.9 %  sodium chloride infusion ( Intravenous New Bag/Given 06/11/17 2302)  sodium chloride 0.9 % bolus 1,000 mL (1,000 mLs Intravenous New Bag/Given 06/12/17 0013)     Initial Impression / Assessment and Plan / ED Course  I have reviewed the triage vital signs and the nursing notes.  Pertinent labs & imaging results that were available during my care of the patient were reviewed by me and considered in my medical decision making (see chart for details).  84 y.o. F here with abnormal labs.  Had routine last 1 week ago with elevated calcium.  Repeat labs today at PCP office with Calcium 14.86.  Patient has had some mild diarrhea and abdominal cramping.  Also diagnosed with UTI today at PCP office and started on abx.  She is AAOx3 here.  Abdomen soft, non-tender.  Repeat labs with calcium < 15.  Also has evidence of AKI with SrCr doubled from baseline.  Given IVF.  PTH and ionized calcium pending.  Will admit for ongoing care.  Discussed with Dr. Maudie Mercury-- will admit.  Final Clinical Impressions(s) / ED Diagnoses   Final diagnoses:  Hypercalcemia  AKI (acute kidney injury) [facility])    ED Discharge Orders    None       Larene Pickett, PA-C 06/12/17 Isaac Laud, MD 06/14/17 [phone]

## 2017-06-11 NOTE — ED Triage Notes (Signed)
Pt's spouse states the pt had blood work done today and they called and said her calcium was 14.68 and they told them to bring her here  Family states pt has had some confusion and today upon arrival to the hospital she had a large amount of diarrhea

## 2017-06-12 ENCOUNTER — Inpatient Hospital Stay (HOSPITAL_COMMUNITY): Payer: Medicare Other

## 2017-06-12 ENCOUNTER — Encounter (HOSPITAL_COMMUNITY): Payer: Self-pay | Admitting: Internal Medicine

## 2017-06-12 ENCOUNTER — Ambulatory Visit: Payer: Medicare Other

## 2017-06-12 ENCOUNTER — Other Ambulatory Visit (HOSPITAL_COMMUNITY): Payer: Self-pay

## 2017-06-12 ENCOUNTER — Other Ambulatory Visit: Payer: Self-pay

## 2017-06-12 DIAGNOSIS — M069 Rheumatoid arthritis, unspecified: Secondary | ICD-10-CM | POA: Diagnosis present

## 2017-06-12 DIAGNOSIS — I10 Essential (primary) hypertension: Secondary | ICD-10-CM | POA: Diagnosis present

## 2017-06-12 DIAGNOSIS — N179 Acute kidney failure, unspecified: Secondary | ICD-10-CM

## 2017-06-12 DIAGNOSIS — Z87891 Personal history of nicotine dependence: Secondary | ICD-10-CM | POA: Diagnosis not present

## 2017-06-12 DIAGNOSIS — Z8042 Family history of malignant neoplasm of prostate: Secondary | ICD-10-CM | POA: Diagnosis not present

## 2017-06-12 DIAGNOSIS — R410 Disorientation, unspecified: Secondary | ICD-10-CM | POA: Diagnosis not present

## 2017-06-12 DIAGNOSIS — Z853 Personal history of malignant neoplasm of breast: Secondary | ICD-10-CM

## 2017-06-12 DIAGNOSIS — E871 Hypo-osmolality and hyponatremia: Secondary | ICD-10-CM

## 2017-06-12 DIAGNOSIS — K869 Disease of pancreas, unspecified: Secondary | ICD-10-CM | POA: Diagnosis present

## 2017-06-12 DIAGNOSIS — D649 Anemia, unspecified: Secondary | ICD-10-CM

## 2017-06-12 DIAGNOSIS — C7951 Secondary malignant neoplasm of bone: Secondary | ICD-10-CM | POA: Diagnosis present

## 2017-06-12 DIAGNOSIS — Z888 Allergy status to other drugs, medicaments and biological substances status: Secondary | ICD-10-CM | POA: Diagnosis not present

## 2017-06-12 DIAGNOSIS — R599 Enlarged lymph nodes, unspecified: Secondary | ICD-10-CM | POA: Diagnosis present

## 2017-06-12 DIAGNOSIS — M8580 Other specified disorders of bone density and structure, unspecified site: Secondary | ICD-10-CM | POA: Diagnosis present

## 2017-06-12 DIAGNOSIS — F329 Major depressive disorder, single episode, unspecified: Secondary | ICD-10-CM | POA: Diagnosis present

## 2017-06-12 DIAGNOSIS — D6489 Other specified anemias: Secondary | ICD-10-CM | POA: Diagnosis not present

## 2017-06-12 DIAGNOSIS — Z515 Encounter for palliative care: Secondary | ICD-10-CM | POA: Diagnosis not present

## 2017-06-12 DIAGNOSIS — Z808 Family history of malignant neoplasm of other organs or systems: Secondary | ICD-10-CM

## 2017-06-12 DIAGNOSIS — R591 Generalized enlarged lymph nodes: Secondary | ICD-10-CM | POA: Diagnosis not present

## 2017-06-12 DIAGNOSIS — K5790 Diverticulosis of intestine, part unspecified, without perforation or abscess without bleeding: Secondary | ICD-10-CM | POA: Diagnosis present

## 2017-06-12 DIAGNOSIS — K219 Gastro-esophageal reflux disease without esophagitis: Secondary | ICD-10-CM

## 2017-06-12 DIAGNOSIS — Z801 Family history of malignant neoplasm of trachea, bronchus and lung: Secondary | ICD-10-CM | POA: Diagnosis not present

## 2017-06-12 DIAGNOSIS — G9341 Metabolic encephalopathy: Secondary | ICD-10-CM | POA: Diagnosis present

## 2017-06-12 DIAGNOSIS — C259 Malignant neoplasm of pancreas, unspecified: Secondary | ICD-10-CM | POA: Diagnosis present

## 2017-06-12 DIAGNOSIS — E869 Volume depletion, unspecified: Secondary | ICD-10-CM | POA: Diagnosis present

## 2017-06-12 DIAGNOSIS — E878 Other disorders of electrolyte and fluid balance, not elsewhere classified: Secondary | ICD-10-CM | POA: Diagnosis not present

## 2017-06-12 DIAGNOSIS — N390 Urinary tract infection, site not specified: Secondary | ICD-10-CM | POA: Diagnosis present

## 2017-06-12 DIAGNOSIS — Z881 Allergy status to other antibiotic agents status: Secondary | ICD-10-CM | POA: Diagnosis not present

## 2017-06-12 DIAGNOSIS — Z923 Personal history of irradiation: Secondary | ICD-10-CM | POA: Diagnosis not present

## 2017-06-12 DIAGNOSIS — F419 Anxiety disorder, unspecified: Secondary | ICD-10-CM | POA: Diagnosis present

## 2017-06-12 DIAGNOSIS — D638 Anemia in other chronic diseases classified elsewhere: Secondary | ICD-10-CM | POA: Diagnosis not present

## 2017-06-12 DIAGNOSIS — R1909 Other intra-abdominal and pelvic swelling, mass and lump: Secondary | ICD-10-CM | POA: Diagnosis present

## 2017-06-12 DIAGNOSIS — R1902 Left upper quadrant abdominal swelling, mass and lump: Secondary | ICD-10-CM | POA: Diagnosis not present

## 2017-06-12 LAB — COMPREHENSIVE METABOLIC PANEL
ALT: 23 U/L (ref 14–54)
ANION GAP: 9 (ref 5–15)
AST: 65 U/L — ABNORMAL HIGH (ref 15–41)
Albumin: 3.4 g/dL — ABNORMAL LOW (ref 3.5–5.0)
Alkaline Phosphatase: 77 U/L (ref 38–126)
BILIRUBIN TOTAL: 0.5 mg/dL (ref 0.3–1.2)
BUN: 22 mg/dL — ABNORMAL HIGH (ref 6–20)
CO2: 24 mmol/L (ref 22–32)
Calcium: 13.4 mg/dL (ref 8.9–10.3)
Chloride: 98 mmol/L — ABNORMAL LOW (ref 101–111)
Creatinine, Ser: 1.77 mg/dL — ABNORMAL HIGH (ref 0.44–1.00)
GFR, EST AFRICAN AMERICAN: 30 mL/min — AB (ref >60)
GFR, EST NON AFRICAN AMERICAN: 26 mL/min — AB (ref >60)
Glucose, Bld: 99 mg/dL (ref 65–99)
Potassium: 3.5 mmol/L (ref 3.5–5.1)
Sodium: 131 mmol/L — ABNORMAL LOW (ref 135–145)
TOTAL PROTEIN: 6.4 g/dL — AB (ref 6.5–8.1)

## 2017-06-12 LAB — URINALYSIS, ROUTINE W REFLEX MICROSCOPIC
Bilirubin Urine: NEGATIVE
GLUCOSE, UA: NEGATIVE mg/dL
KETONES UR: NEGATIVE mg/dL
Nitrite: NEGATIVE
PH: 5 (ref 5.0–8.0)
Protein, ur: NEGATIVE mg/dL
Specific Gravity, Urine: 1.013 (ref 1.005–1.030)

## 2017-06-12 LAB — SODIUM, URINE, RANDOM: Sodium, Ur: 10 mmol/L

## 2017-06-12 LAB — RENAL FUNCTION PANEL
Albumin: 3.6 g/dL (ref 3.5–5.0)
Anion gap: 9 (ref 5–15)
BUN: 22 mg/dL — ABNORMAL HIGH (ref 6–20)
CALCIUM: 13.6 mg/dL — AB (ref 8.9–10.3)
CO2: 26 mmol/L (ref 22–32)
CREATININE: 1.78 mg/dL — AB (ref 0.44–1.00)
Chloride: 99 mmol/L — ABNORMAL LOW (ref 101–111)
GFR calc Af Amer: 29 mL/min — ABNORMAL LOW (ref >60)
GFR calc non Af Amer: 25 mL/min — ABNORMAL LOW (ref >60)
GLUCOSE: 103 mg/dL — AB (ref 65–99)
Phosphorus: 3.2 mg/dL (ref 2.5–4.6)
Potassium: 3.6 mmol/L (ref 3.5–5.1)
SODIUM: 134 mmol/L — AB (ref 135–145)

## 2017-06-12 LAB — CBC
HCT: 29.3 % — ABNORMAL LOW (ref 36.0–46.0)
HEMOGLOBIN: 9.7 g/dL — AB (ref 12.0–15.0)
MCH: 29.2 pg (ref 26.0–34.0)
MCHC: 33.1 g/dL (ref 30.0–36.0)
MCV: 88.3 fL (ref 78.0–100.0)
Platelets: 301 10*3/uL (ref 150–400)
RBC: 3.32 MIL/uL — ABNORMAL LOW (ref 3.87–5.11)
RDW: 13.6 % (ref 11.5–15.5)
WBC: 4.7 10*3/uL (ref 4.0–10.5)

## 2017-06-12 LAB — SEDIMENTATION RATE: SED RATE: 21 mm/h (ref 0–22)

## 2017-06-12 LAB — TSH: TSH: 2.821 u[IU]/mL (ref 0.350–4.500)

## 2017-06-12 MED ORDER — CEPHALEXIN 500 MG PO CAPS
500.0000 mg | ORAL_CAPSULE | Freq: Two times a day (BID) | ORAL | Status: DC
  Administered 2017-06-12: 500 mg via ORAL
  Filled 2017-06-12: qty 1

## 2017-06-12 MED ORDER — ZOLEDRONIC ACID 4 MG/5ML IV CONC
4.0000 mg | Freq: Once | INTRAVENOUS | Status: AC
  Administered 2017-06-12: 4 mg via INTRAVENOUS
  Filled 2017-06-12: qty 5

## 2017-06-12 MED ORDER — ACETAMINOPHEN 650 MG RE SUPP: 650.0000 mg | Freq: Four times a day (QID) | RECTAL | Status: DC | PRN

## 2017-06-12 MED ORDER — ACETAMINOPHEN 325 MG PO TABS
650.0000 mg | ORAL_TABLET | Freq: Four times a day (QID) | ORAL | Status: DC | PRN
  Administered 2017-06-13 – 2017-06-17 (×3): 650 mg via ORAL
  Filled 2017-06-12 (×3): qty 2

## 2017-06-12 MED ORDER — CALCITONIN (SALMON) 200 UNIT/ML IJ SOLN
240.0000 [IU] | Freq: Two times a day (BID) | INTRAMUSCULAR | Status: AC
  Administered 2017-06-12 – 2017-06-14 (×4): 240 [IU] via SUBCUTANEOUS
  Filled 2017-06-12 (×4): qty 1.2

## 2017-06-12 MED ORDER — CEPHALEXIN 250 MG PO CAPS
250.0000 mg | ORAL_CAPSULE | Freq: Two times a day (BID) | ORAL | Status: DC
  Administered 2017-06-12 – 2017-06-13 (×3): 250 mg via ORAL
  Filled 2017-06-12 (×4): qty 1

## 2017-06-12 MED ORDER — BUPROPION HCL ER (XL) 300 MG PO TB24
300.0000 mg | ORAL_TABLET | Freq: Every day | ORAL | Status: DC
  Administered 2017-06-12 – 2017-06-19 (×8): 300 mg via ORAL
  Filled 2017-06-12: qty 2
  Filled 2017-06-12 (×7): qty 1

## 2017-06-12 MED ORDER — SUCRALFATE 1 G PO TABS
1.0000 g | ORAL_TABLET | Freq: Four times a day (QID) | ORAL | Status: DC
  Administered 2017-06-12 – 2017-06-19 (×24): 1 g via ORAL
  Filled 2017-06-12 (×25): qty 1

## 2017-06-12 MED ORDER — SODIUM CHLORIDE 0.9 % IV SOLN
INTRAVENOUS | Status: DC
  Administered 2017-06-12 – 2017-06-13 (×5): via INTRAVENOUS

## 2017-06-12 MED ORDER — OXYCODONE-ACETAMINOPHEN 5-325 MG PO TABS
1.0000 | ORAL_TABLET | ORAL | Status: DC | PRN
  Administered 2017-06-17 – 2017-06-18 (×2): 1 via ORAL
  Filled 2017-06-12 (×2): qty 1

## 2017-06-12 MED ORDER — HYDRALAZINE HCL 20 MG/ML IJ SOLN
10.0000 mg | Freq: Four times a day (QID) | INTRAMUSCULAR | Status: DC | PRN
  Administered 2017-06-12: 10 mg via INTRAVENOUS
  Filled 2017-06-12: qty 1

## 2017-06-12 MED ORDER — ENOXAPARIN SODIUM 30 MG/0.3ML ~~LOC~~ SOLN
30.0000 mg | SUBCUTANEOUS | Status: DC
  Administered 2017-06-12 – 2017-06-13 (×2): 30 mg via SUBCUTANEOUS
  Filled 2017-06-12 (×2): qty 0.3

## 2017-06-12 NOTE — ED Notes (Signed)
Date and time results received: 06/12/17 1130 (use smartphrase ".now" to insert current time)  Test: Ca+ Critical Value: 13.6  Name of Provider Notified: Loma Sousa, RN  Orders Received? Or Actions Taken?:

## 2017-06-12 NOTE — Consult Note (Signed)
New Hematology/Oncology Consult   Referral MD: Dr Jani Gravel  Reason for Referral: Finding of a mass in the tail of pancreas or abutting the tail of pancreas in the context of severe hypercalcemia with adjusted calcium on admission over 15.    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  On presentation, assessment revealed hyponatremia, acute kidney injury with creatinine of 2.0 with baseline creatinine around 1.1, elevation of AST, but no ALT or alkaline phosphatase elevation.  Additional laboratory abnormalities include progressive anemia with hemoglobin of 10.9 on admission with normocytic normochromic red blood cells.  Ultrasound of the abdomen obtained to evaluate acute kidney injury and hepatic abnormalities frustrated no liver abnormalities, some perihepatic fluid and a mass adjacent or inside the tail of the pancreas measuring 2.0 cm with additional retroperitoneal and peripancreatic lymphadenopathy.  Additional heterogeneous mass in the region of the splenic hilum measuring up to 6.7 cm.  No pancreatic ductal dilation or evidence of pancreatitis.  Evaluated today, patient denies any symptoms.  She reports no fevers, chills, night sweats.  No uncontrolled pain.  Denies any shortness of breath, nausea, vomiting, abdominal pain, constipation.  Denies dysuria or hematuria.  Denies any weakness or sensation deficits in the hands or feet.     Past Medical History:  Diagnosis Date  . ARTHRITIS, RHEUMATOID 11/29/2006  . Breast cancer ([place])    left  . BURSITIS, SHOULDER 08/09/2008  . DCIS (ductal carcinoma in situ)   . DEPRESSION 02/24/2007  . DIVERTICULOSIS, COLON 02/24/2007  . External hemorrhoids   . GERD 02/24/2007  . Hypercalcemia   . OSTEOPENIA 11/29/2006  . Personal history  of radiation therapy 02/1998  :  Past Surgical History:  Procedure Laterality Date  . ABDOMINAL HYSTERECTOMY    . APPENDECTOMY    . BREAST BIOPSY Left 01/03/1998   malignant  . BREAST LUMPECTOMY Left 01/1998  . CATARACT EXTRACTION     righ and left  . CHOLECYSTECTOMY    . COLONOSCOPY  11/13/2002   diverticulosis, external hemorrhoids  . DILATION AND CURETTAGE OF UTERUS    . Fatty Tumor Excision     rigth forearm  . RECTOCELE REPAIR     and cystocele  . ROTATOR CUFF REPAIR  2010   left, Dr. Lorin Mercy  . UPPER GASTROINTESTINAL ENDOSCOPY  01/25/2011   Normal  :   Current Facility-Administered Medications:  .  0.9 %  sodium chloride infusion, , Intravenous, Continuous, Jani Gravel, MD, Last Rate: 200 mL/hr at 06/12/17 1109 .  acetaminophen (TYLENOL) tablet 650 mg, 650 mg, Oral, Q6H PRN **OR** acetaminophen (TYLENOL) suppository 650 mg, 650 mg, Rectal, Q6H PRN, Jani Gravel, MD .  buPROPion (WELLBUTRIN XL) 24 hr tablet 300 mg, 300 mg, Oral, Daily, Jani Gravel, MD, 300 mg at 06/12/17 1105 .  calcitonin (MIACALCIN) injection 240 Units, 240 Units, Subcutaneous, BID, Roney Jaffe, MD, 240 Units at 06/12/17 1545 .  cephALEXin (KEFLEX) capsule 250 mg, 250 mg, Oral, BID, Lenis Noon, RPH .  enoxaparin (LOVENOX) injection 30 mg, 30 mg, Subcutaneous, Q24H, Jani Gravel, MD .  hydrALAZINE (APRESOLINE) injection 10 mg, 10 mg, Intravenous, Q6H PRN, Jani Gravel, MD, 10 mg at 06/12/17 0135 .  oxyCODONE-acetaminophen (PERCOCET/ROXICET) 5-325 MG per tablet 1 tablet, 1 tablet, Oral, Q4H PRN, Jani Gravel, MD .  sucralfate (CARAFATE) tablet 1 g,  1 g, Oral, QID, Jani Gravel, MD, 1 g at 06/12/17 1545 .  zolendronic acid (ZOMETA) 4 mg in sodium chloride 0.9 % 100 mL IVPB, 4 mg, Intravenous, Once, Jani Gravel, MD, Last Rate: 420 mL/hr at 06/12/17 1546, 4 mg at 06/12/17 1546:  . buPROPion  300 mg Oral Daily  . calcitonin  240 Units Subcutaneous BID  . cephALEXin  250 mg Oral BID  . enoxaparin (LOVENOX) injection   30 mg Subcutaneous Q24H  . sucralfate  1 g Oral QID  :  Allergies  Allergen Reactions  . Amoxicillin Other (See Comments)    Severe yeast infections  . Reglan [Metoclopramide] Other (See Comments)    Chest pain  :   Family History  Problem Relation Age of Onset  . Lung cancer Mother   . Coronary artery disease Mother   . Prostate cancer Father   . Liver cancer Father   . Kidney failure Brother   . Coronary artery disease Brother   . Thyroid disease Sister      Social History   Socioeconomic History  . Marital status: Married    Spouse name: Not on file  . Number of children: 1  . Years of education: Not on file  . Highest education level: Not on file  Occupational History  . Occupation: retired  Scientific laboratory technician  . Financial resource strain: Not on file  . Food insecurity:    Worry: Not on file    Inability: Not on file  . Transportation needs:    Medical: Not on file    Non-medical: Not on file  Tobacco Use  . Smoking status: Former Smoker    Last attempt to quit: 02/19/1978    Years since quitting: 39.3  . Smokeless tobacco: Never Used  Substance and Sexual Activity  . Alcohol use: No  . Drug use: No  . Sexual activity: Not on file  Lifestyle  . Physical activity:    Days per week: Not on file    Minutes per session: Not on file  . Stress: Not on file  Relationships  . Social connections:    Talks on phone: Not on file    Gets together: Not on file    Attends religious service: Not on file    Active member of club or organization: Not on file    Attends meetings of clubs or organizations: Not on file    Relationship status: Not on file  . Intimate partner violence:    Fear of current or ex partner: Not on file    Emotionally abused: Not on file    Physically abused: Not on file    Forced sexual activity: Not on file  Other Topics Concern  . Not on file  Social History Narrative   1 cup of coffee daily. Stopped 4 months ago   Review of  Systems: As per HPI.  All other systems are negative.  Physical Exam:  Blood pressure 139/71, pulse 88, temperature 97.9 F (36.6 C), temperature source Oral, resp. rate 19, height 5\' 3"  (1.6 m), weight 125 lb 3.5 oz (56.8 kg), SpO2 97 %.  Awake, alert, oriented x2. HEENT: Anicteric sclera, moist mucous membranes.  No mucosal lesions. Lungs: Clear to auscultation bilaterally without expiratory wheezing Cardiac: S1/S2, regular, no rubs or gallops.  2/6 systolic murmur at the right heart base. Abdomen: Soft, nontender, mildly distended without palpable or percussible organomegaly. Lymph nodes: Palpable lymphadenopathy in the cervical, supraclavicular, axillary, or inguinal regions Neurologic: Grossly  intact with no focal findings Skin: Clear without rash, petechiae, or ecchymosis. Musculoskeletal: No significant peripheral edema.  LABS:  Recent Labs    06/11/17 2249 06/12/17 1028  WBC 4.9 4.7  HGB 10.9* 9.7*  HCT 33.1* 29.3*  PLT 324 301    Recent Labs    06/11/17 2249 06/12/17 1028  NA 132* 131*  134*  K 4.1 3.5  3.6  CL 94* 98*  99*  CO2 26 24  26   GLUCOSE 95 99  103*  BUN 26* 22*  22*  CREATININE 2.05* 1.77*  1.78*  CALCIUM >15.0* 13.4*  13.6*      RADIOLOGY:  Dg Chest 2 View  Result Date: 06/12/2017 CLINICAL DATA:  Hyperglycemia, confusion, high blood pressure. EXAM: CHEST - 2 VIEW COMPARISON:  01/31/2011 FINDINGS: Hyperinflation suggesting emphysema. Mild cardiac enlargement. No vascular congestion, edema, or consolidation. Linear scarring and calcified granulomas in the upper lungs likely representing postinflammatory change no blunting of costophrenic angles no pneumothorax. Mediastinal contours appear intact. Degenerative changes in the spine and shoulders. Postoperative changes in both shoulders. Surgical clips in the left breast. Aortic calcification. IMPRESSION: Emphysematous changes in the lungs. Chronic postinflammatory changes in the apices. No  evidence of active pulmonary disease. Aortic atherosclerosis. Electronically Signed   By: Lucienne Capers M.D.   On: 06/12/2017 01:59   US Abdomen Complete  Result Date: 06/12/2017 CLINICAL DATA:  Acute renal failure. Abnormal liver function. History of breast cancer. EXAM: ABDOMEN ULTRASOUND COMPLETE COMPARISON:  None. FINDINGS: Gallbladder: The gallbladder is surgically absent. Common bile duct: Diameter: 6 mm, normal Liver: No focal lesion identified. Within normal limits in parenchymal echogenicity. Portal vein is patent on color Doppler imaging with normal direction of blood flow towards the liver. Minimal fluid around the age of the liver. Small right pleural effusion. IVC: No abnormality visualized. Pancreas: There appears to be a mass lesion in or adjacent to the tail of the pancreas measuring about 2 cm maximal diameter. Additional nodules in the retroperitoneum and around the pancreas likely represent enlarged lymph nodes. Heterogeneous mass in the region of the splenic hilum measures up to 6.7 cm diameter. No pancreatic ductal dilatation or peripancreatic fluid. Spleen: Size and appearance within normal limits. Right Kidney: Length: 9.8 cm. Increased parenchymal echotexture consistent with chronic medical renal disease. No mass or hydronephrosis visualized. Left Kidney: Length: 10 cm. Increased parenchymal echotexture consistent with chronic medical renal disease. No mass or hydronephrosis visualized. Abdominal aorta: No aneurysm visualized. Other findings: None. IMPRESSION: 1. Retroperitoneal mass lesions in the splenic hilar region, adjacent to the pancreas, and possibly within the tail of the pancreas. These lesions likely represent enlarged lymph nodes or possibly primary tumors. Differential diagnosis would include metastasis, pancreatic carcinoma, and/or lymphoma. Suggest CT abdomen and pelvis for further evaluation. 2. Increased renal parenchymal echotexture bilaterally consistent with chronic  medical renal disease. No hydronephrosis. 3. Small right pleural effusion. Small amount of upper abdominal ascites. Electronically Signed   By: Lucienne Capers M.D.   On: 06/12/2017 06:51    Assessment and Plan:  84 year old female with history of DCIS in the 90s reportedly treated with surgery and radiation.  Patient does not remember whether she has received any additional medication such as aromatase inhibitors at this time.  Resenting with severe hypercalcemia, altered mental status, and acute kidney injury.  These findings are occurring in the context of what appears to be a pancreatic tail lesion with associated lymphadenopathy in the retroperitoneum although this is based on ultrasound alone.  Findings are worrisome for possible malignancy with pancreatic cancer versus lymphoma versus another metastatic malignancy on differential.  Recommendations: -Agree with the current plan for hypercalcemia management -Consider bilateral mammogram, consider bone scan while awaiting renal function improvement - When creatinine is improved, consider obtaining CT of the chest/abdomen/pelvis for more accurate staging imaging -Consult gastroenterology for upper endoscopy and EUS with possible ultrasound-guided biopsy of the pancreatic or splenic hilar mass.  Continue following patient and will update my recommendations as the new information becomes available.   Ardath Sax, MD 06/12/2017, 3:59 PM

## 2017-06-12 NOTE — Progress Notes (Signed)
[facility] Progress Note  Subjective: no new c/o's, US showed panc mass and sig LAN in abdomen  Vitals:   06/12/17 0245 06/12/17 0330 06/12/17 0415 06/12/17 0500  BP: (!) 148/64 (!) 152/80 140/63 (!) 144/77  Pulse: 88 92 85 81  Resp: _0 Temp:      TempSrc:      SpO2: 96% 97% 92% 98%    Inpatient medications: . buPROPion  300 mg Oral Daily  . calcitonin  240 Units Subcutaneous BID  . cephALEXin  500 mg Oral BID  . enoxaparin (LOVENOX) injection  30 mg Subcutaneous Q24H  . sucralfate  1 g Oral QID   . sodium chloride    . zoledronic acid (ZOMETA) IV     acetaminophen **OR** acetaminophen, hydrALAZINE, oxyCODONE-acetaminophen  Exam: 1. General  lying in bed in NAD,  2. Normal affect and insight, Not Suicidal or Homicidal, Awake Alert, Oriented X 2. 3. No F.N deficits, ALL C.Nerves Intact, Strength 5/5 all 4 extremities, Sensation intact all 4 extremities, Plantars down going.  Poor memory for recent events 4. Ears and Eyes appear Normal, Conjunctivae clear, PERRLA. Moist Oral Mucosa. 5. Supple Neck, No JVD, No cervical lymphadenopathy appriciated, No Carotid Bruits. 6. Symmetrical Chest wall movement, Good air movement bilaterally, CTAB. 7. RRR, No Gallops, Rubs or Murmurs, No Parasternal Heave. 8. Positive Bowel Sounds, Abdomen Soft, No tenderness, No organomegaly appriciated,No rebound -guarding or rigidity. 9.  No Cyanosis, Normal Skin Turgor, No Skin Rash or Bruise. 10. Good muscle tone,  joints appear normal , no effusions, Normal ROM. 11. No Palpable Lymph Nodes in Neck or Axillae     Brief Summary: Monique Crosby  is a 84 y.o. female, w h/o breast cancer (DCIS), Rheumatoid arthritis, Osteopenia, Monique Crosby who presents with hypercalcemia. May have some slight altered mental status per family.  Pt denies polyuria.  Pt is axox3. Pt was sent by PCP to ER for evaluation of hypercalcemia.  In Ed,  Urinalysis wbc 11-20, rbc 0-5 Wbc 4.9, Hgb 10.9, Plt 324 Na 132,  K 4.1,  Bun 26, Creatinine 2.05 Calcium >15.0 Ast 74, Alt 28, Alk phos 93, T. Bili 0.5 Pt was admitted for hypercalcemia and ARF      Impression/Plan:  Principal Problem:   Hypercalcemia Active Problems:   Anemia   ARF (acute renal failure) (HCC)   Altered mental status, mild   1) Hypercalcemia - [place] abd showing pancreatic mass/ LAN, likely this is malignancy related, CT recommended - prob wait for creat to improve unless noncon study is ok .  - called requesting oncology consult - ordered vit D 25 and 1/,25, magnesium, phos, ESR, Tsh, myeloma panel, PTH, PTH rP - Hydrate with ns iv 200/hr x 48 hrs, place foley - zolendronic acid 63m iv x1- - Calcitonin x 48 hrs - Daily Ca levels  2) ARF Check urine sodium, urine creatinine, urine  IVF"s as above Daily creat  3) Abnormal liver function Check acute hepatitis panel Check abdominal ultrasound  4) Hypertension HOLDING Losartan due to ARF Hydralazine 173miv q6h prn sbp >160  5) Anemia Check cbc in am to ensure stability Consider ferritin, iron, tibc, b12, folate.   6) Rheumatoid arthritis Percocet 5/23535mo q6h prn   7) Anxiety  Cont Wellbutrin  8) UTI Cont keflex 500m42m bid  9) AMS - due to ^^Ca++ levels    DVT Prophylaxis  Lovenox - SCDs   Code Status FULL CODE  Likely DC to  home  Condition - stable  Consults called: oncology  Admission status: inpatient     Rob  MD [facility] pgr [phone] 06/12/2017, 8:57 AM   Recent Labs  Lab 06/11/17 2249  NA 132*  K 4.1  CL 94*  CO2 26  GLUCOSE 95  BUN 26*  CREATININE 2.05*  CALCIUM >15.0*   Recent Labs  Lab 06/11/17 2249  AST 74*  ALT 28  ALKPHOS 93  BILITOT 0.5  PROT 7.6  ALBUMIN 4.1   Recent Labs  Lab 06/11/17 2249  WBC 4.9  NEUTROABS 3.7  HGB 10.9*  HCT 33.1*  MCV 88.3  PLT 324   Iron/TIBC/Ferritin/ %Sat No results found for: IRON, TIBC, FERRITIN, IRONPCTSAT  

## 2017-06-12 NOTE — ED Notes (Signed)
Date and time results received: 06/12/17 2350   Test: Calcium Critical Value: >15  Name of Provider Notified:L. Tye Savoy

## 2017-06-12 NOTE — H&P (Addendum)
[facility] H&P   Patient Demographics:    Monique Crosby, is a 84 y.o. female  MRN: [id number]   DOB - 1932-03-11  Admit Date - 06/11/2017  Outpatient Primary MD for the patient is Antony Contras, MD  Referring MD/NP/PA: Quincy Carnes  Outpatient Specialists:    Silvano Rusk  Patient coming from: home  Chief Complaint  Patient presents with  . abnormal labs      HPI:    Monique Crosby  is a 84 y.o. female, w h/o breast cancer (DCIS), Rheumatoid arthritis, Osteopenia, Monique Crosby who presents with hypercalcemia. May have some slight altered mental status per family.  Pt denies polyuria.  Pt is axox3. Pt was sent by PCP to ER for evaluation of hypercalcemia.    In Ed,  Urinalysis wbc 11-20, rbc 0-5  Wbc 4.9, Hgb 10.9, Plt 324 Na 132, K 4.1,  Bun 26, Creatinine 2.05  Calclium >15.0  Ast 74, Alt 28, Alk phos 93, T. Bili 0.5  Pt will be admitted for hypercalcemia and ARF  Review of systems:    In addition to the HPI above,  No Fever-chills, No Headache, No changes with Vision or hearing, No problems swallowing food or Liquids, No Chest pain, Cough or Shortness of Breath, No Abdominal pain, No Nausea or Vommitting, Bowel movements are regular, No Blood in stool or Urine, No dysuria, No new skin rashes or bruises, No new joints pains-aches,  No new weakness, tingling, numbness in any extremity, No recent weight gain or loss, No polyuria, polydypsia or polyphagia, No significant Mental Stressors.  A full 10 point Review of Systems was done, except as stated above, all other Review of Systems were negative.   With Past History of the following :    Past Medical History:  Diagnosis Date  . ARTHRITIS, RHEUMATOID 11/29/2006  . Breast cancer ([place])    left  . BURSITIS, SHOULDER 08/09/2008  . DCIS (ductal carcinoma in situ)   . DEPRESSION 02/24/2007  . DIVERTICULOSIS, COLON 02/24/2007   . External hemorrhoids   . GERD 02/24/2007  . OSTEOPENIA 11/29/2006  . Personal history of radiation therapy 02/1998      Past Surgical History:  Procedure Laterality Date  . ABDOMINAL HYSTERECTOMY    . APPENDECTOMY    . BREAST BIOPSY Left 01/03/1998   malignant  . BREAST LUMPECTOMY Left 01/1998  . CATARACT EXTRACTION     righ and left  . CHOLECYSTECTOMY    . COLONOSCOPY  11/13/2002   diverticulosis, external hemorrhoids  . DILATION AND CURETTAGE OF UTERUS    . Fatty Tumor Excision     rigth forearm  . RECTOCELE REPAIR     and cystocele  . ROTATOR CUFF REPAIR  2010   left, Dr. Lorin Mercy  . UPPER GASTROINTESTINAL ENDOSCOPY  01/25/2011   Normal      Social History:     Social History  Tobacco Use  . Smoking status: Former Smoker    Last attempt to quit: 02/19/1978    Years since quitting: 39.3  . Smokeless tobacco: Never Used  Substance Use Topics  . Alcohol use: No     Lives - at home  Mobility - walks by self  Family History :     Family History  Problem Relation Age of Onset  . Lung cancer Mother   . Coronary artery disease Mother   . Prostate cancer Father   . Liver cancer Father   . Kidney failure Brother   . Coronary artery disease Brother   . Thyroid disease Sister        Home Medications:   Prior to Admission medications   Medication Sig Start Date End Date Taking? Authorizing Provider  buPROPion (WELLBUTRIN XL) 300 MG 24 hr tablet Take 1 tablet (300 mg total) by mouth daily. 07/04/12  Yes Marletta Lor, MD  cephALEXin (KEFLEX) 500 MG capsule Take 500 mg by mouth 2 (two) times daily.  06/11/17  Yes [provider]  losartan (COZAAR) 100 MG tablet Take 100 mg by mouth daily.  06/03/17  Yes [provider]  sucralfate (CARAFATE) 1 G tablet TAKE 1 TABLET BY MOUTH 4 TIMES A DAY 01/17/13  Yes Marletta Lor, MD  ALPRAZolam Duanne Moron) 1 MG tablet TAKE 1 TABLET EVERY DAY as needed Patient not taking: Reported on 06/11/2017  07/04/12   Marletta Lor, MD  oxyCODONE-acetaminophen (PERCOCET/ROXICET) 5-325 MG per tablet Take 1 tablet by mouth every 4 (four) hours as needed. Patient not taking: Reported on 06/11/2017 02/28/13   Larene Pickett, PA-C     Allergies:     Allergies  Allergen Reactions  . Amoxicillin Other (See Comments)    Severe yeast infections  . Reglan [Metoclopramide] Other (See Comments)    Chest pain     Physical Exam:   Vitals  Blood pressure (!) 199/91, pulse 92, temperature 97.7 F (36.5 C), temperature source Oral, resp. rate 15, SpO2 97 %.   1. General  lying in bed in NAD,   2. Normal affect and insight, Not Suicidal or Homicidal, Awake Alert, Oriented X 3.  3. No F.N deficits, ALL C.Nerves Intact, Strength 5/5 all 4 extremities, Sensation intact all 4 extremities, Plantars down going.  4. Ears and Eyes appear Normal, Conjunctivae clear, PERRLA. Moist Oral Mucosa.  5. Supple Neck, No JVD, No cervical lymphadenopathy appriciated, No Carotid Bruits.  6. Symmetrical Chest wall movement, Good air movement bilaterally, CTAB.  7. RRR, No Gallops, Rubs or Murmurs, No Parasternal Heave.  8. Positive Bowel Sounds, Abdomen Soft, No tenderness, No organomegaly appriciated,No rebound -guarding or rigidity.  9.  No Cyanosis, Normal Skin Turgor, No Skin Rash or Bruise.  10. Good muscle tone,  joints appear normal , no effusions, Normal ROM.  11. No Palpable Lymph Nodes in Neck or Axillae     Data Review:    CBC Recent Labs  Lab 06/11/17 2249  WBC 4.9  HGB 10.9*  HCT 33.1*  PLT 324  MCV 88.3  MCH 29.1  MCHC 32.9  RDW 13.5  LYMPHSABS 0.7  MONOABS 0.4  EOSABS 0.0  BASOSABS 0.0   ------------------------------------------------------------------------------------------------------------------  Chemistries  Recent Labs  Lab 06/11/17 2249  NA 132*  K 4.1  CL 94*  CO2 26  GLUCOSE 95  BUN 26*  CREATININE 2.05*  CALCIUM >15.0*  AST 74*  ALT 28  ALKPHOS  93  BILITOT 0.5   ------------------------------------------------------------------------------------------------------------------  CrCl cannot be calculated (Unknown ideal weight.). ------------------------------------------------------------------------------------------------------------------ No results for input(s): TSH, T4TOTAL, T3FREE, THYROIDAB in the last 72 hours.  Invalid input(s): FREET3  Coagulation profile No results for input(s): INR, PROTIME in the last 168 hours. ------------------------------------------------------------------------------------------------------------------- No results for input(s): DDIMER in the last 72 hours. -------------------------------------------------------------------------------------------------------------------  Cardiac Enzymes No results for input(s): CKMB, TROPONINI, MYOGLOBIN in the last 168 hours.  Invalid input(s): CK ------------------------------------------------------------------------------------------------------------------ No results found for: BNP   ---------------------------------------------------------------------------------------------------------------  Urinalysis    Component Value Date/Time   COLORURINE YELLOW 06/12/2017 0000   APPEARANCEUR HAZY (A) 06/12/2017 0000   LABSPEC 1.013 06/12/2017 0000   PHURINE 5.0 06/12/2017 0000   GLUCOSEU NEGATIVE 06/12/2017 0000   HGBUR SMALL (A) 06/12/2017 0000   HGBUR small 07/15/2007 1612   BILIRUBINUR NEGATIVE 06/12/2017 0000   KETONESUR NEGATIVE 06/12/2017 0000   PROTEINUR NEGATIVE 06/12/2017 0000   UROBILINOGEN 0.2 12/24/2008 1132   NITRITE NEGATIVE 06/12/2017 0000   LEUKOCYTESUR SMALL (A) 06/12/2017 0000    ----------------------------------------------------------------------------------------------------------------   Imaging Results:    No results found.    Assessment & Plan:    Principal Problem:   Hypercalcemia Active Problems:   Anemia   ARF  (acute renal failure) (HCC)   Hypercalcemia Check vitamin D, 25-oh, vitamin D 1, 25 -oh, magnesium, phos, ESR, Tsh, myeloma panel, PTH, PTH rP Hydrate with ns iv zolendronic acid 63m iv x1 Check cmp in am  ARF Check urine sodium, urine creatinine, urine  Check Abdominal ultrasound  Abnormal liver function Check acute hepatitis panel Check abdominal ultrasound  Hypertension STOP Losartan due to ARF Hydralazine 151miv q6h prn sbp >160  Anemia Check cbc in am to ensure stability Consider ferritin, iron, tibc, b12, folate.   Rheumatoid arthritis Percocet 5/23547mo q6h prn   Anxiety  Cont Wellbutrin  UTI Cont keflex 500m58m bid    DVT Prophylaxis  Lovenox - SCDs   AM Labs Ordered, also please review Full Orders  Family Communication: Admission, patients condition and plan of care including tests being ordered have been discussed with the patient  who indicate understanding and agree with the plan and Code Status.  Code Status FULL CODE  Likely DC to  home  Condition GUARDED   Consults called: none  Admission status: inpatient   Time spent in minutes : 45   JameJani Gravel on 06/12/2017 at 1:10 AM  Between 7am to 7pm - Pager - [phone] 7pm go to [email or web address] - password [email or web address] - Office  [phone]

## 2017-06-12 NOTE — Progress Notes (Signed)
PHARMACY NOTE:  ANTIMICROBIAL RENAL DOSAGE ADJUSTMENT  Current antimicrobial regimen includes a mismatch between antimicrobial dosage and estimated renal function.  As per policy approved by the Pharmacy & Therapeutics and Medical Executive Committees, the antimicrobial dosage will be adjusted accordingly.  Current antimicrobial dosage:  Cephalexin 500 mg PO BID  Indication: UTI  Renal Function: SCr 1.8, CrCl ~20 mL/min  Estimated Creatinine Clearance: 20.2 mL/min (A) (by C-G formula based on SCr of 1.78 mg/dL (H)).    Antimicrobial dosage has been changed to:  Cephalexin 250 mg PO BID  Thank you for allowing pharmacy to be a part of this patient's care.  [place], [facility] 06/12/2017 2:16 PM

## 2017-06-13 ENCOUNTER — Inpatient Hospital Stay (HOSPITAL_COMMUNITY): Payer: Medicare Other

## 2017-06-13 ENCOUNTER — Other Ambulatory Visit: Payer: Self-pay

## 2017-06-13 DIAGNOSIS — K8689 Other specified diseases of pancreas: Secondary | ICD-10-CM

## 2017-06-13 DIAGNOSIS — R1902 Left upper quadrant abdominal swelling, mass and lump: Secondary | ICD-10-CM

## 2017-06-13 DIAGNOSIS — R410 Disorientation, unspecified: Secondary | ICD-10-CM

## 2017-06-13 DIAGNOSIS — D6489 Other specified anemias: Secondary | ICD-10-CM

## 2017-06-13 DIAGNOSIS — E869 Volume depletion, unspecified: Secondary | ICD-10-CM

## 2017-06-13 LAB — CBC
HCT: 26.7 % — ABNORMAL LOW (ref 36.0–46.0)
HEMOGLOBIN: 9 g/dL — AB (ref 12.0–15.0)
MCH: 29.6 pg (ref 26.0–34.0)
MCHC: 33.7 g/dL (ref 30.0–36.0)
MCV: 87.8 fL (ref 78.0–100.0)
Platelets: 299 10*3/uL (ref 150–400)
RBC: 3.04 MIL/uL — AB (ref 3.87–5.11)
RDW: 13.7 % (ref 11.5–15.5)
WBC: 5.6 10*3/uL (ref 4.0–10.5)

## 2017-06-13 LAB — MAGNESIUM: MAGNESIUM: 1.2 mg/dL — AB (ref 1.7–2.4)

## 2017-06-13 LAB — HEPATITIS PANEL, ACUTE
HEP A IGM: NEGATIVE
HEP B C IGM: NEGATIVE
Hepatitis B Surface Ag: NEGATIVE

## 2017-06-13 LAB — URINE CULTURE: Culture: <10000 — AB

## 2017-06-13 LAB — RENAL FUNCTION PANEL
ANION GAP: 10 (ref 5–15)
Albumin: 3.2 g/dL — ABNORMAL LOW (ref 3.5–5.0)
BUN: 19 mg/dL (ref 6–20)
CALCIUM: 11.6 mg/dL — AB (ref 8.9–10.3)
CHLORIDE: 103 mmol/L (ref 101–111)
CO2: 21 mmol/L — AB (ref 22–32)
CREATININE: 1.52 mg/dL — AB (ref 0.44–1.00)
GFR calc non Af Amer: 31 mL/min — ABNORMAL LOW (ref >60)
GFR, EST AFRICAN AMERICAN: 36 mL/min — AB (ref >60)
GLUCOSE: 96 mg/dL (ref 65–99)
Phosphorus: 2.7 mg/dL (ref 2.5–4.6)
Potassium: 3.3 mmol/L — ABNORMAL LOW (ref 3.5–5.1)
SODIUM: 134 mmol/L — AB (ref 135–145)

## 2017-06-13 LAB — PARATHYROID HORMONE, INTACT (NO CA): PTH: 12 pg/mL — AB (ref 15–65)

## 2017-06-13 LAB — VITAMIN D 25 HYDROXY (VIT D DEFICIENCY, FRACTURES): Vit D, 25-Hydroxy: 32.2 ng/mL (ref 30.0–100.0)

## 2017-06-13 LAB — CALCIUM / CREATININE RATIO, URINE
CALCIUM UR: 40.2 mg/dL
Calcium/Creat.Ratio: 344 mg/g creat — ABNORMAL HIGH (ref 0–260)
Creatinine, Urine: 117 mg/dL

## 2017-06-13 LAB — CALCITRIOL (1,25 DI-OH VIT D): VIT D 1 25 DIHYDROXY: 190 pg/mL — AB (ref 19.9–79.3)

## 2017-06-13 LAB — CALCIUM, IONIZED: CALCIUM, IONIZED, SERUM: 8.1 mg/dL — AB (ref 4.5–5.6)

## 2017-06-13 MED ORDER — TECHNETIUM TC 99M MEDRONATE IV KIT
21.4000 | PACK | Freq: Once | INTRAVENOUS | Status: AC | PRN
  Administered 2017-06-13: 21.4 via INTRAVENOUS

## 2017-06-13 MED ORDER — AMLODIPINE BESYLATE 5 MG PO TABS
5.0000 mg | ORAL_TABLET | Freq: Every day | ORAL | Status: DC
  Administered 2017-06-13 – 2017-06-19 (×7): 5 mg via ORAL
  Filled 2017-06-13 (×7): qty 1

## 2017-06-13 MED ORDER — ONDANSETRON HCL 4 MG/2ML IJ SOLN
4.0000 mg | Freq: Four times a day (QID) | INTRAMUSCULAR | Status: DC | PRN
  Administered 2017-06-13: 4 mg via INTRAVENOUS
  Filled 2017-06-13: qty 2

## 2017-06-13 MED ORDER — FUROSEMIDE 10 MG/ML IJ SOLN: 80.0000 mg | Freq: Four times a day (QID) | INTRAMUSCULAR | Status: DC

## 2017-06-13 MED ORDER — FUROSEMIDE 10 MG/ML IJ SOLN
INTRAMUSCULAR | Status: AC
  Administered 2017-06-13: 80 mg via INTRAVENOUS
  Filled 2017-06-13: qty 4

## 2017-06-13 MED ORDER — FUROSEMIDE 10 MG/ML IJ SOLN
80.0000 mg | Freq: Three times a day (TID) | INTRAMUSCULAR | Status: AC
  Administered 2017-06-13 (×2): 80 mg via INTRAVENOUS
  Filled 2017-06-13: qty 8

## 2017-06-13 NOTE — Progress Notes (Signed)
Discuss case with hospital team need CT or MRI of the pancreas to then decide whether EUS or IR biopsy is the best way to obtain tissue will await those results and then officially consult if need be

## 2017-06-13 NOTE — Progress Notes (Signed)
Pt. r/t room from imaging. C/o "chest hurts" when breathing. Shallow rapid resp, O2 sat 60's, pulse 116-121, diaphoretic. Placed on non-rebreather 100%, rapid response called. Attending also called. O2 sat quickly improved. EKG done, attending in to see pt., new orders received. IV lasix given per orders, f/c placed. O2 sat 100%, resp 24/min. Pt. states chest pain improving.

## 2017-06-13 NOTE — Progress Notes (Signed)
[facility] Progress Note  Subjective: no new c/o's, eating better per the husband.    Vitals:   06/12/17 1315 06/12/17 1354 06/12/17 2235 06/13/17 0540  BP: (!) 152/70 139/71 (!) 156/71 (!) 157/66  Pulse: 89 88 86 96  Resp: 19  19 (!) 21  Temp:  97.9 F (36.6 C) 97.8 F (36.6 C) 98 F (36.7 C)  TempSrc:  Oral Oral   SpO2: 96% 97% 94% 90%  Weight:  56.8 kg (125 lb 3.5 oz)  58 kg (127 lb 12.8 oz)  Height:  _0  (1.6 m)      Inpatient medications: . buPROPion  300 mg Oral Daily  . calcitonin  240 Units Subcutaneous BID  . cephALEXin  250 mg Oral BID  . enoxaparin (LOVENOX) injection  30 mg Subcutaneous Q24H  . sucralfate  1 g Oral QID   . sodium chloride 200 mL/hr at 06/13/17 1004   acetaminophen **OR** acetaminophen, hydrALAZINE, ondansetron, oxyCODONE-acetaminophen  Exam: 1. General  lying in bed in NAD,  2. Normal affect and insight, Not Suicidal or Homicidal, Awake Alert, Oriented X 2. 3. No F.N deficits, ALL C.Nerves Intact, Strength 5/5 all 4 extremities, Sensation intact all 4 extremities, Plantars down going.  Poor memory for recent events 4. Ears and Eyes appear Normal, Conjunctivae clear, PERRLA. Moist Oral Mucosa. 5. Supple Neck, No JVD, No cervical lymphadenopathy appriciated, No Carotid Bruits. 6. Symmetrical Chest wall movement, Good air movement bilaterally, CTAB. 7. RRR, No Gallops, Rubs or Murmurs, No Parasternal Heave. 8. Positive Bowel Sounds, Abdomen Soft, No tenderness, No organomegaly appriciated,No rebound -guarding or rigidity. 9.  No Cyanosis, Normal Skin Turgor, No Skin Rash or Bruise. 10. Good muscle tone,  joints appear normal , no effusions, Normal ROM. 11. No Palpable Lymph Nodes in Neck or Axillae     Brief Summary: Monique Crosby  is a 84 y.o. female, w h/o breast cancer (DCIS), Rheumatoid arthritis, Osteopenia, Monique Crosby who presents with hypercalcemia. May have some slight altered mental status per family.  Pt denies polyuria.  Pt is  axox3. Pt was sent by PCP to ER for evaluation of hypercalcemia.  In Ed,  Urinalysis wbc 11-20, rbc 0-5 Wbc 4.9, Hgb 10.9, Plt 324 Na 132, K 4.1,  Bun 26, Creatinine 2.05 Calcium >15.0 Ast 74, Alt 28, Alk phos 93, T. Bili 0.5  Pt was admitted for hypercalcemia and ARF      Impression/Plan:  Principal Problem:   Hypercalcemia Active Problems:   Anemia   ARF (acute renal failure) (HCC)   Altered mental status, mild   1) Hypercalcemia - [place] abd showing pancreatic mass/ LAN, likely this is malignancy related, CT recommended - prob wait for creat to improve unless noncon study is ok .  - seen by ONC , appreciate input - pending labs vit D 25 and 1/,25, magnesium, phos, ESR, Tsh, myeloma, PTH,PTH RP - Ca down 15 >> 11.6 today - will reduce NS to 100 /hr - sp zolendronic acid 18m iv x1 - Calcitonin day 2 of 2 - Daily Ca levels - have d/w patient / husband about hypercalecmia, chance of abdominal malignancy, etc..., questions answered  2)  Abdominal masses/ LAN- abdominal [place]> - [place]domen:  "retroperitoneal mass lesions in the splenic hilar region,adjacent to the pancreas, and possibly within the tail of thepancreas. These lesions likely represent enlarged lymph nodes or possibly primary tumors. Differential diagnosis would include metastasis, pancreatic carcinoma, and/or lymphoma. Suggest CT abdomen and pelvis for further evaluation" -  seen by ONC, appreciate input - will need CT abdomen w/ contrast when renal function improved - called GI, said would need to see CT abdomen first before considered any diagnostic procedures; they suspect this will be IR procedure, call them back as needed - unable to do mammogram as inpatient; bone scan has been ordered  3) ARF - creat improving w/ rx of hyperCa++ and IVF's - creat 2.0 >> 1.5 today  4) Abnormal liver function - Hep B/ C negative - normal liver on [place]  5) Hypertension - HOLDING Losartan due to ARF - will add norvasc 5  qd - Hydralazine 72m iv q6h prn sbp >160  6) Anemia - Check cbc in am Consider ferritin, iron, tibc, b12, folate.   7) Rheumatoid arthritis - Percocet 5/2321mpo q6h prn   8) Anxiety  Cont Wellbutrin  9) Pyuria: keflex dc'd, urine Cx showed < 10K col  10) AMS - due to ^^Ca++ levels and vol depletion, improving    DVT Prophylaxis  Lovenox - SCDs   Code Status FULL CODE  Likely DC to  home  Condition - stable  Consults called: oncology  Admission status: inpatient     RoKelly SplinterD [facility] pgr ([phone]/25/2019, 12:12 PM   Recent Labs  Lab 06/11/17 2249 06/12/17 1028 06/13/17 0439  NA 132* 131*  134* 134*  K 4.1 3.5  3.6 3.3*  CL 94* 98*  99* 103  CO2 _0 21*  GLUCOSE 95 99  103* 96  BUN 26* 22*  22* 19  CREATININE 2.05* 1.77*  1.78* 1.52*  CALCIUM >15.0* 13.4*  13.6* 11.6*  PHOS  --  3.2 2.7   Recent Labs  Lab 06/11/17 2249 06/12/17 1028 06/13/17 0439  AST 74* 65*  --   ALT 28 23  --   ALKPHOS 93 77  --   BILITOT 0.5 0.5  --   PROT 7.6 6.4*  --   ALBUMIN 4.1 3.4*  3.6 3.2*   Recent Labs  Lab 06/11/17 2249 06/12/17 1028 06/13/17 0439  WBC 4.9 4.7 5.6  NEUTROABS 3.7  --   --   HGB 10.9* 9.7* 9.0*  HCT 33.1* 29.3* 26.7*  MCV 88.3 88.3 87.8  PLT 324 301 299   Iron/TIBC/Ferritin/ %Sat No results found for: IRON, TIBC, FERRITIN, IRONPCTSAT

## 2017-06-13 NOTE — Significant Event (Signed)
Rapid Response Event Note  Overview: Time Called: 5374 Arrival Time: 8270    Notified by bedside RN in regards to patient desaturating to the 73s. Notified bedside RN to place NRB on patient and also notify respiratory therapy. Upon arrival, patient was saturating 98% on NRB. Patient's skin appeared to be pale. Patient was alert and oriented, complaining of some chest discomfort.  Initial Focused Assessment: Neuro: Alert and Oriented x4, patient able to follow simple commands, move all extremities with ease, pupils 2+, round, equal and reactive bilaterally. Cardiac: ST, S1 and S2 heard upon auscultation, 1+ pulses in radial and pedal locations bilaterally upon palpation. BP see vitals  Pulmonary: Breath sounds diminished with expiratory wheezes throughout all lung fields, O2 Sats 97-98% on NRB, RR see vitals  Interventions: Placed STAT chest x-ray MD Lebo notified and placed orders for lasix   Plan of Care (if not transferred): Place patient on continuous pulse ox, monitor respiratory status and oxygen requirements, if any changes in respiratory status or mentation please notify Rapid Response or MD.  Event Summary:   at      at          [facility]

## 2017-06-13 NOTE — Consult Note (Signed)
IP PROGRESS NOTE  Subjective:  Significant changes overnight.  Laboratory work demonstrates improving renal function and rapidly declining calcium level.  Today, was able to converse with the patient's husband and her daughter.  Objective: Vital signs in last 24 hours: Blood pressure (!) 149/82, pulse 95, temperature 98 F (36.7 C), resp. rate (!) 24, height 5\' 3"  (1.6 m), weight 127 lb 12.8 oz (58 kg), SpO2 97 %.  Intake/Output from previous day: 04/24 0701 - 04/25 0700 In: 3910 [I.V.:3910] Out: 500 [Urine:500]  Physical Exam: Patient is asleep, examination deferred   Lab Results: Recent Labs    06/12/17 1028 06/13/17 0439  WBC 4.7 5.6  HGB 9.7* 9.0*  HCT 29.3* 26.7*  PLT 301 299    BMET Recent Labs    06/12/17 1028 06/13/17 0439  NA 131*  134* 134*  K 3.5  3.6 3.3*  CL 98*  99* 103  CO2 24  26 21*  GLUCOSE 99  103* 96  BUN 22*  22* 19  CREATININE 1.77*  1.78* 1.52*  CALCIUM 13.4*  13.6* 11.6*    No results found for: CEA1  Studies/Results: Dg Chest 2 View  Result Date: 06/12/2017 CLINICAL DATA:  Hyperglycemia, confusion, high blood pressure. EXAM: CHEST - 2 VIEW COMPARISON:  01/31/2011 FINDINGS: Hyperinflation suggesting emphysema. Mild cardiac enlargement. No vascular congestion, edema, or consolidation. Linear scarring and calcified granulomas in the upper lungs likely representing postinflammatory change no blunting of costophrenic angles no pneumothorax. Mediastinal contours appear intact. Degenerative changes in the spine and shoulders. Postoperative changes in both shoulders. Surgical clips in the left breast. Aortic calcification. IMPRESSION: Emphysematous changes in the lungs. Chronic postinflammatory changes in the apices. No evidence of active pulmonary disease. Aortic atherosclerosis. Electronically Signed   By: Lucienne Capers M.D.   On: 06/12/2017 01:59   Nm Bone Scan Whole Body  Result Date: 06/13/2017 CLINICAL DATA:  Breast cancer. EXAM:  NUCLEAR MEDICINE WHOLE BODY BONE SCAN TECHNIQUE: Whole body anterior and posterior images were obtained approximately 3 hours after intravenous injection of radiopharmaceutical. RADIOPHARMACEUTICALS:  21.4 mCi Technetium-79m MDP IV COMPARISON:  Chest x-ray 06/13/2017.  CT 02/01/2011. FINDINGS: Bilateral renal function and excretion. Multifocal mild areas of increased activity noted about the cervical, thoracic, and lumbar spine. Questionable mild areas of increased activity noted and the anterior ribs bilaterally. More intense area of increased activity noted over the left lower lumbar spine. IMPRESSION: Multifocal areas of increased activity noted over the cervical, thoracic, and lumbar spine particularly over the lower left lumbar spine. Questionable areas of increased activity noted over anterior ribs bilaterally. These areas could represent metastatic disease. Electronically Signed   By: Marcello Moores  Register   On: 06/13/2017 15:10   US Abdomen Complete  Result Date: 06/12/2017 CLINICAL DATA:  Acute renal failure. Abnormal liver function. History of breast cancer. EXAM: ABDOMEN ULTRASOUND COMPLETE COMPARISON:  None. FINDINGS: Gallbladder: The gallbladder is surgically absent. Common bile duct: Diameter: 6 mm, normal Liver: No focal lesion identified. Within normal limits in parenchymal echogenicity. Portal vein is patent on color Doppler imaging with normal direction of blood flow towards the liver. Minimal fluid around the age of the liver. Small right pleural effusion. IVC: No abnormality visualized. Pancreas: There appears to be a mass lesion in or adjacent to the tail of the pancreas measuring about 2 cm maximal diameter. Additional nodules in the retroperitoneum and around the pancreas likely represent enlarged lymph nodes. Heterogeneous mass in the region of the splenic hilum measures up to 6.7 cm  diameter. No pancreatic ductal dilatation or peripancreatic fluid. Spleen: Size and appearance within normal  limits. Right Kidney: Length: 9.8 cm. Increased parenchymal echotexture consistent with chronic medical renal disease. No mass or hydronephrosis visualized. Left Kidney: Length: 10 cm. Increased parenchymal echotexture consistent with chronic medical renal disease. No mass or hydronephrosis visualized. Abdominal aorta: No aneurysm visualized. Other findings: None. IMPRESSION: 1. Retroperitoneal mass lesions in the splenic hilar region, adjacent to the pancreas, and possibly within the tail of the pancreas. These lesions likely represent enlarged lymph nodes or possibly primary tumors. Differential diagnosis would include metastasis, pancreatic carcinoma, and/or lymphoma. Suggest CT abdomen and pelvis for further evaluation. 2. Increased renal parenchymal echotexture bilaterally consistent with chronic medical renal disease. No hydronephrosis. 3. Small right pleural effusion. Small amount of upper abdominal ascites. Electronically Signed   By: Lucienne Capers M.D.   On: 06/12/2017 06:51   Dg Chest Port 1 View  Result Date: 06/13/2017 CLINICAL DATA:  Acute respiratory failure. EXAM: PORTABLE CHEST 1 VIEW COMPARISON:  Chest x-ray 06/12/2017. FINDINGS: Mediastinum and hilar structures normal. Cardiomegaly. New onset diffuse bilateral pulmonary infiltrates/edema bilateral pleural effusions. No pneumothorax. IMPRESSION: Cardiomegaly. New onset of diffuse bilateral pulmonary infiltrates/edema. Small bilateral pleural effusions. Findings most consistent with CHF. Electronically Signed   By: Marcello Moores  Register   On: 06/13/2017 14:48    Medications: I have reviewed the patient's current medications.  Assessment/Plan: 84 year old female with history of DCIS in the 90s reportedly treated with surgery and radiation.  Patient does not remember whether she has received any additional medication such as aromatase inhibitors at this time.  Resenting with severe hypercalcemia, altered mental status, and acute kidney injury.   These findings are occurring in the context of what appears to be a pancreatic tail lesion with associated lymphadenopathy in the retroperitoneum although this is based on ultrasound alone.  Findings are worrisome for possible malignancy with pancreatic cancer versus lymphoma versus another metastatic malignancy on differential.  Bone scan demonstrates presence of extensive skeletal disease further supporting suspicious for a metastatic malignancy.  Recommendations: -Recommend MRI of the brain for additional disease staging. -Agree with the current plan for hypercalcemia management -Agree with the plan on CT of the chest/abdomen/pelvis after patient's creatinine decreases to 1.0 or below -Appreciate input from gastroenterology and will await their recommendations regarding timing of upper endoscopy and EUS with possible ultrasound-guided biopsy of the pancreatic or splenic hilar mass unless another target for biopsy demonstrates itself.  Continue following patient and will update my recommendations as the new information becomes available.     LOS: 1 day   Ardath Sax, MD   06/13/2017, 4:42 PM

## 2017-06-14 LAB — CBC
HEMATOCRIT: 29.8 % — AB (ref 36.0–46.0)
HEMOGLOBIN: 9.7 g/dL — AB (ref 12.0–15.0)
MCH: 28.5 pg (ref 26.0–34.0)
MCHC: 32.6 g/dL (ref 30.0–36.0)
MCV: 87.6 fL (ref 78.0–100.0)
Platelets: 293 10*3/uL (ref 150–400)
RBC: 3.4 MIL/uL — ABNORMAL LOW (ref 3.87–5.11)
RDW: 13.8 % (ref 11.5–15.5)
WBC: 9.3 10*3/uL (ref 4.0–10.5)

## 2017-06-14 LAB — PHOSPHORUS: PHOSPHORUS: 2.8 mg/dL (ref 2.5–4.6)

## 2017-06-14 LAB — RENAL FUNCTION PANEL
ALBUMIN: 3.6 g/dL (ref 3.5–5.0)
ANION GAP: 14 (ref 5–15)
BUN: 22 mg/dL — ABNORMAL HIGH (ref 6–20)
CO2: 23 mmol/L (ref 22–32)
Calcium: 11.2 mg/dL — ABNORMAL HIGH (ref 8.9–10.3)
Chloride: 98 mmol/L — ABNORMAL LOW (ref 101–111)
Creatinine, Ser: 1.78 mg/dL — ABNORMAL HIGH (ref 0.44–1.00)
GFR calc Af Amer: 29 mL/min — ABNORMAL LOW (ref >60)
GFR, EST NON AFRICAN AMERICAN: 25 mL/min — AB (ref >60)
Glucose, Bld: 100 mg/dL — ABNORMAL HIGH (ref 65–99)
PHOSPHORUS: 2.9 mg/dL (ref 2.5–4.6)
POTASSIUM: 2.9 mmol/L — AB (ref 3.5–5.1)
Sodium: 135 mmol/L (ref 135–145)

## 2017-06-14 LAB — MAGNESIUM: MAGNESIUM: 1.1 mg/dL — AB (ref 1.7–2.4)

## 2017-06-14 MED ORDER — POTASSIUM CHLORIDE CRYS ER 20 MEQ PO TBCR
40.0000 meq | EXTENDED_RELEASE_TABLET | Freq: Two times a day (BID) | ORAL | Status: AC
  Administered 2017-06-14 (×2): 40 meq via ORAL
  Filled 2017-06-14 (×2): qty 2

## 2017-06-14 MED ORDER — DEXTROSE 5 % IV SOLN
3.0000 g | Freq: Once | INTRAVENOUS | Status: AC
  Administered 2017-06-14: 3 g via INTRAVENOUS
  Filled 2017-06-14: qty 6

## 2017-06-14 MED ORDER — POTASSIUM CHLORIDE 10 MEQ/100ML IV SOLN
10.0000 meq | INTRAVENOUS | Status: AC
  Administered 2017-06-14 (×3): 10 meq via INTRAVENOUS
  Filled 2017-06-14 (×3): qty 100

## 2017-06-14 MED ORDER — HEPARIN SODIUM (PORCINE) 5000 UNIT/ML IJ SOLN
5000.0000 [IU] | Freq: Three times a day (TID) | INTRAMUSCULAR | Status: DC
  Administered 2017-06-14 – 2017-06-17 (×8): 5000 [IU] via SUBCUTANEOUS
  Filled 2017-06-14 (×7): qty 1

## 2017-06-14 MED ORDER — LORAZEPAM 2 MG/ML IJ SOLN
1.0000 mg | Freq: Once | INTRAMUSCULAR | Status: AC | PRN
  Administered 2017-06-15: 1 mg via INTRAVENOUS
  Filled 2017-06-14: qty 1

## 2017-06-14 NOTE — Progress Notes (Signed)
PROGRESS NOTE    Monique Crosby  NWG:[id number] DOB: 1932/04/28 DOA: 06/11/2017 PCP: Antony Contras, MD   Brief Narrative: [age] y.o.female,w h/o breast cancer (DCIS), Rheumatoid arthritis, Osteopenia, GERD who presents with hypercalcemia.  He was sent to the emergency room via PCP for hypercalcemia and was found to be having some altered mental status as well as encephalopathy. Pt was admitted for hypercalcemia and ARF and found to have a pancreatic lesion with likely metastasis.  Work-up was underway for pancreatic malignancy and Oncology is following. GI awaiting abdominal imaging before formally consultating or making further decisions.     Assessment & Plan:   Principal Problem:   Hypercalcemia Active Problems:   Anemia   ARF (acute renal failure) (HCC)   Pancreatic mass  Hypercalcemia likely of Malignancy -U/S abd showing pancreatic mass/ LAN, likely this is malignancy related,  -CT of the Chest/Abd/Pelvis with Contrast recommended; Will need to wait for Creatinine to improve as I discussed the case with Dr. Lebron Conners who feels that doing this study without contrast has little to no utility.  -Seen by Oncology Dr. Lebron Conners and appreciate further evaluation recommendations  - Vit D 25 and 1,25 were 32.2 and 190.0 respectively, ESR was 21, TSH was 2.821, myeloma, PTH was 12, PTH RP pending  -Ca2+ trended down from greater than 15 ->11.2 today -IVF Rehydration Discontinued  -S/p Zolendronic Acid 50m iv x1 and likely probably contributing to the kidney dysfunction -Calcitonin day 2 of 2 to be complete today  -Repeat CMP in AM   Abdominal Masses/ LAN -U/S abdomen showed:  "retroperitoneal mass lesions in the splenic hilar region,adjacent to the pancreas, and possibly within the tail of the pancreas. These lesions likely represent enlarged lymph nodes or possibly primary tumors. Differential diagnosis would include metastasis, pancreatic carcinoma, and/or lymphoma. Suggest CT  abdomen and pelvis for further evaluation" -Seen by Oncology Dr. PLebron Connersand appreciate further evaluation recommendations  -CT of the Chest/Abd/Pelvis with Contrast recommended; Will need to wait for Creatinine to improve as I discussed the case with Dr. PLebron Connerswho feels that doing this study without contrast has little to no utility.  -Dr. SJonnie Finnerdiscussed with ESadie HaberGI Dr. MWatt Climeswho said would need to see CT Abdomen or MRI of the Pancrease first before considering any diagnostic procedures such as EUS or IR Biopsy -Unable to do mammogram as inpatient -Bone Scan showed Multifocal areas of increased activity noted over the cervical, thoracic, and lumbar spine particularly over the lower left lumbar spine. Questionable areas of increased activity noted over anterior ribs bilaterally. These areas could represent metastatic disease -Because Cr still elevated will obtain MRCP w/o Contrast evaluate the Abdomen -Also we will obtain MRI of the brain without contrast  Acute Renal Failure -BUN/creatinine went from 26/2.05 -> trended down to 19/1.52/trended up again and is now 22/1.78 -Possibly worsened in the setting of Zometa and recent IV Lasix dosage with 80 mg every 8 hours x2 doses -IV fluid hydration has now stopped given the patient appeared volume overloaded yesterday -We will change Enoxaparin 30 mg subcu every 24 hours to Heparin 5000 units subcu q8h  -Avoid Nephrotoxic Medications if possible continue to hold losartan 100 mg p.o. daily -CMP in the AM.  Abnormal AST -Hep B/ C Negative -Normal liver on [place]-T need to monitor repeat CMP in a.m.  Hypertension -Continue to Hold Losartan due to ARF -Amlodipine 5 mg daily added -C/w Hydralazine 10 mg IV q6h prn sbp >160  Normocytic Anemia -Hb/Hct  Stable at 9.7/29.8 -Check Anemia Panel in a.m. -Continue to monitor for signs and symptoms of bleeding -Repeat CBC in the a.m.  Rheumatoid Arthritis -Continue with oxycodone-acetaminophen  5-325 mg/tab. 1 tablet every 4 hours as needed for severe pain  Depression and Anxiety  -Continue with Wellbutrin 300 mg 24-hour tablet p.o. Daily -Home alprazolam 1 mg p.o. daily RN held  Pyuria -Abx with Keflex Discontinued,  -Urine Cx showed < 10K col  Acute Metabolic Encephalopathy -Likely Secondary due to hypercalcemia and volume depletion  -Improving but still remains somewhat confused -We will obtain MRI of the brain without contrast -Continue to monitor very closely  Hypomagnesemia -She has magnesium level this a.m. was 1.1 and yesterday was 1.2 -Replete with IV mag sulfate 3 g  -Continue to monitor and replete as necessary  -Repeat Mag level in a.m.  Hypokalemia -Patient's potassium level exam was 2.9 -Replete with 40 mEq p.o. twice daily as well as IV 30 mEq KCl -Continue to monitor and replete as necessary -Repeat-CMP in the a.m.  History of Breast Cancer -Malignancy Workup underway for pancreatic lesion -Will need outpatient mammogram  DVT prophylaxis: Heparin 5000 units subcu every 8 hours Code Status: FULL CODE Family Communication: Discussed with husband at bedside Disposition Plan: Remain inpatient at this time  Consultants:   Medical Oncology  Case was Discussed with Gastroenterology   Procedures: None  Antimicrobials:  Anti-infectives (From admission, onward)   Start     Dose/Rate Route Frequency Ordered Stop   06/12/17 2200  cephALEXin (KEFLEX) capsule 250 mg  Status:  Discontinued     250 mg Oral 2 times daily 06/12/17 1415 06/14/17 1004   06/12/17 1000  cephALEXin (KEFLEX) capsule 500 mg  Status:  Discontinued     500 mg Oral 2 times daily 06/12/17 0751 06/12/17 1415     Subjective: Seen and examined at bedside and still remain intermittently confused.  Is awake and alert and only oriented to herself.  Thought she was at home.  Denies any chest pain, shortness breath, nausea, vomiting, or any abdominal pain.  No other concerns or  complaints at this time  Objective: Vitals:   06/13/17 1600 06/13/17 2043 06/14/17 0513 06/14/17 0516  BP: (!) 149/82 (!) 137/93  (!) 153/80  Pulse: 95 93  92  Resp:  18  20  Temp:  97.9 F (36.6 C) 98.5 F (36.9 C)   TempSrc:  Oral Oral   SpO2: 97% 99%  99%  Weight:   55.4 kg (122 lb 2.2 oz)   Height:        Intake/Output Summary (Last 24 hours) at 06/14/2017 1532 Last data filed at 06/14/2017 1446 Gross per 24 hour  Intake 826 ml  Output 4350 ml  Net -3524 ml   Filed Weights   06/12/17 1354 06/13/17 0540 06/14/17 0513  Weight: 56.8 kg (125 lb 3.5 oz) 58 kg (127 lb 12.8 oz) 55.4 kg (122 lb 2.2 oz)   Examination: Physical Exam:  Constitutional: Thin elderly Caucasian female in NAD and appears calm and comfortable Eyes: Lids and conjunctivae normal, sclerae anicteric  ENMT: External Ears, Nose appear normal. Grossly normal hearing.  Neck: Appears normal, supple, no cervical masses, normal ROM, no appreciable thyromegaly; no JVD Respiratory: Diminished to auscultation bilaterally, no wheezing, rales, rhonchi or crackles. Normal respiratory effort and patient is not tachypenic. No accessory muscle use.  Cardiovascular: Slightly tachycardic rate but regular rhythm, no murmurs / rubs / gallops. S1 and S2 auscultated. No extremity edema. Abdomen:  Soft, non-tender, non-distended. No masses palpated. No appreciable hepatosplenomegaly. Bowel sounds positive x4.  GU: Deferred. Musculoskeletal: No clubbing / cyanosis of digits/nails. No joint deformity upper and lower extremities.  Skin: No rashes, lesions, ulcers on a limited skin evaluatuation. No induration; Warm and dry.  Neurologic: CN 2-12 grossly intact with no focal deficits. Romberg sign and cerebellar reflexes not assessed.  Psychiatric: Impaired judgment and insight. Alert and oriented x 1. Normal mood and appropriate affect.   Data Reviewed: I have personally reviewed following labs and imaging studies  CBC: Recent Labs    Lab 06/19/2017 2249 06/12/17 1028 06/13/17 0439 06/14/17 0523  WBC 4.9 4.7 5.6 9.3  NEUTROABS 3.7  --   --   --   HGB 10.9* 9.7* 9.0* 9.7*  HCT 33.1* 29.3* 26.7* 29.8*  MCV 88.3 88.3 87.8 87.6  PLT 324 301 299 449   Basic Metabolic Panel: Recent Labs  Lab 19-Jun-2017 2249 06/12/17 1028 06/13/17 0439 06/14/17 0523  NA 132* 131*  134* 134* 135  K 4.1 3.5  3.6 3.3* 2.9*  CL 94* 98*  99* 103 98*  CO2 _0 21* 23  GLUCOSE 95 99  103* 96 100*  BUN 26* 22*  22* 19 22*  CREATININE 2.05* 1.77*  1.78* 1.52* 1.78*  CALCIUM >15.0* 13.4*  13.6* 11.6* 11.2*  MG  --   --  1.2* 1.1*  PHOS  --  3.2 2.7 2.8  2.9   GFR: Estimated Creatinine Clearance: 20.2 mL/min (A) (by C-G formula based on SCr of 1.78 mg/dL (H)). Liver Function Tests: Recent Labs  Lab 06/19/2017 2249 06/12/17 1028 06/13/17 0439 06/14/17 0523  AST 74* 65*  --   --   ALT 28 23  --   --   ALKPHOS 93 77  --   --   BILITOT 0.5 0.5  --   --   PROT 7.6 6.4*  --   --   ALBUMIN 4.1 3.4*  3.6 3.2* 3.6   No results for input(s): LIPASE, AMYLASE in the last 168 hours. No results for input(s): AMMONIA in the last 168 hours. Coagulation Profile: No results for input(s): INR, PROTIME in the last 168 hours. Cardiac Enzymes: No results for input(s): CKTOTAL, CKMB, CKMBINDEX, TROPONINI in the last 168 hours. BNP (last 3 results) No results for input(s): PROBNP in the last 8760 hours. HbA1C: No results for input(s): HGBA1C in the last 72 hours. CBG: No results for input(s): GLUCAP in the last 168 hours. Lipid Profile: No results for input(s): CHOL, HDL, LDLCALC, TRIG, CHOLHDL, LDLDIRECT in the last 72 hours. Thyroid Function Tests: Recent Labs    06/12/17 0114  TSH 2.821   Anemia Panel: No results for input(s): VITAMINB12, FOLATE, FERRITIN, TIBC, IRON, RETICCTPCT in the last 72 hours. Sepsis Labs: No results for input(s): PROCALCITON, LATICACIDVEN in the last 168 hours.  Recent Results (from the past 240  hour(s))  Urine culture     Status: Abnormal   Collection Time: 06/12/17 12:00 AM  Result Value Ref Range Status   Specimen Description   Final    URINE, CLEAN CATCH Performed at [facility], [place] [address], [place], [place] [address]    Special Requests   Final    NONE Performed at [facility], [place] [address], [place], [place] [address]    Culture (A)  Final    <10,000 COLONIES/mL INSIGNIFICANT GROWTH Performed at [facility] [address], [place], [place]  [address]    Report Status 06/13/2017 FINAL  Final     Radiology Studies: Nm Bone Scan Whole Body  Result Date: 06/13/2017 CLINICAL DATA:  Breast cancer. EXAM: NUCLEAR MEDICINE WHOLE BODY BONE SCAN TECHNIQUE: Whole body anterior and posterior images were obtained approximately 3 hours after intravenous injection of radiopharmaceutical. RADIOPHARMACEUTICALS:  21.4 mCi Technetium-46mMDP IV COMPARISON:  Chest x-ray 06/13/2017.  CT 02/01/2011. FINDINGS: Bilateral renal function and excretion. Multifocal mild areas of increased activity noted about the cervical, thoracic, and lumbar spine. Questionable mild areas of increased activity noted and the anterior ribs bilaterally. More intense area of increased activity noted over the left lower lumbar spine. IMPRESSION: Multifocal areas of increased activity noted over the cervical, thoracic, and lumbar spine particularly over the lower left lumbar spine. Questionable areas of increased activity noted over anterior ribs bilaterally. These areas could represent metastatic disease. Electronically Signed   By: TMarcello Moores Register   On: 06/13/2017 15:10   Dg Chest Port 1 View  Result Date: 06/13/2017 CLINICAL DATA:  Acute respiratory failure. EXAM: PORTABLE CHEST 1 VIEW COMPARISON:  Chest x-ray 06/12/2017. FINDINGS: Mediastinum and hilar structures normal. Cardiomegaly. New onset diffuse bilateral pulmonary infiltrates/edema bilateral pleural effusions.  No pneumothorax. IMPRESSION: Cardiomegaly. New onset of diffuse bilateral pulmonary infiltrates/edema. Small bilateral pleural effusions. Findings most consistent with CHF. Electronically Signed   By: TMarcello Moores Register   On: 06/13/2017 14:48   Scheduled Meds: . amLODipine  5 mg Oral Daily  . buPROPion  300 mg Oral Daily  . enoxaparin (LOVENOX) injection  30 mg Subcutaneous Q24H  . potassium chloride  40 mEq Oral BID  . sucralfate  1 g Oral QID   Continuous Infusions: . magnesium sulfate 1 - 4 g bolus IVPB 3 g (06/14/17 1446)    LOS: 2 days   OKerney Elbe DO [facility] Pager [phone] If 7PM-7AM, please contact night-coverage [email or web address] Password [facility]4/26/2019, 3:32 PM

## 2017-06-14 NOTE — Progress Notes (Signed)
Creatinine still up oncology to order head CT and if creatinine does not decrease could order oral only CTor noncontrast MRI and I will alert my partner this weekend to wait on those results to then decide how we can best help and please call us if questions or problems arise sooner

## 2017-06-15 ENCOUNTER — Inpatient Hospital Stay (HOSPITAL_COMMUNITY): Payer: Medicare Other

## 2017-06-15 DIAGNOSIS — Z515 Encounter for palliative care: Secondary | ICD-10-CM

## 2017-06-15 LAB — COMPREHENSIVE METABOLIC PANEL
ALK PHOS: 71 U/L (ref 38–126)
ALT: 20 U/L (ref 14–54)
AST: 75 U/L — AB (ref 15–41)
Albumin: 3.3 g/dL — ABNORMAL LOW (ref 3.5–5.0)
Anion gap: 13 (ref 5–15)
BILIRUBIN TOTAL: 0.5 mg/dL (ref 0.3–1.2)
BUN: 24 mg/dL — AB (ref 6–20)
CALCIUM: 9.9 mg/dL (ref 8.9–10.3)
CHLORIDE: 96 mmol/L — AB (ref 101–111)
CO2: 23 mmol/L (ref 22–32)
CREATININE: 1.55 mg/dL — AB (ref 0.44–1.00)
GFR, EST AFRICAN AMERICAN: 35 mL/min — AB (ref >60)
GFR, EST NON AFRICAN AMERICAN: 30 mL/min — AB (ref >60)
Glucose, Bld: 88 mg/dL (ref 65–99)
Potassium: 3.6 mmol/L (ref 3.5–5.1)
Sodium: 132 mmol/L — ABNORMAL LOW (ref 135–145)
TOTAL PROTEIN: 5.8 g/dL — AB (ref 6.5–8.1)

## 2017-06-15 LAB — CBC WITH DIFFERENTIAL/PLATELET
BASOS ABS: 0 10*3/uL (ref 0.0–0.1)
Basophils Relative: 0 %
Eosinophils Absolute: 0 10*3/uL (ref 0.0–0.7)
Eosinophils Relative: 0 %
HEMATOCRIT: 26.8 % — AB (ref 36.0–46.0)
HEMOGLOBIN: 8.8 g/dL — AB (ref 12.0–15.0)
LYMPHS PCT: 9 %
Lymphs Abs: 0.7 10*3/uL (ref 0.7–4.0)
MCH: 28.6 pg (ref 26.0–34.0)
MCHC: 32.8 g/dL (ref 30.0–36.0)
MCV: 87 fL (ref 78.0–100.0)
Monocytes Absolute: 0.7 10*3/uL (ref 0.1–1.0)
Monocytes Relative: 8 %
NEUTROS ABS: 6.4 10*3/uL (ref 1.7–7.7)
Neutrophils Relative %: 83 %
Platelets: 295 10*3/uL (ref 150–400)
RBC: 3.08 MIL/uL — AB (ref 3.87–5.11)
RDW: 13.5 % (ref 11.5–15.5)
WBC: 7.8 10*3/uL (ref 4.0–10.5)

## 2017-06-15 LAB — MAGNESIUM: MAGNESIUM: 1.6 mg/dL — AB (ref 1.7–2.4)

## 2017-06-15 LAB — PHOSPHORUS: Phosphorus: 1.4 mg/dL — ABNORMAL LOW (ref 2.5–4.6)

## 2017-06-15 MED ORDER — POTASSIUM CHLORIDE CRYS ER 20 MEQ PO TBCR: 40.0000 meq | EXTENDED_RELEASE_TABLET | Freq: Once | ORAL | Status: DC

## 2017-06-15 MED ORDER — MAGNESIUM SULFATE 2 GM/50ML IV SOLN
2.0000 g | Freq: Once | INTRAVENOUS | Status: AC
  Administered 2017-06-15: 2 g via INTRAVENOUS
  Filled 2017-06-15: qty 50

## 2017-06-15 MED ORDER — POTASSIUM PHOSPHATES 15 MMOLE/5ML IV SOLN
30.0000 mmol | Freq: Once | INTRAVENOUS | Status: AC
  Administered 2017-06-15: 30 mmol via INTRAVENOUS
  Filled 2017-06-15: qty 10

## 2017-06-15 NOTE — Consult Note (Signed)
                                                                                 Consultation Note Date: 06/15/2017   Patient Name: Monique Crosby  DOB: 11/10/1932  MRN: [id number]  Age / Sex: 84 y.o., female  PCP: Swayne, David, MD Referring Physician: Sheikh, Omair Latif, DO  Reason for Consultation: Establishing goals of care  HPI/Patient Profile: 84 y.o. female  with past medical history of h/o breast cancer, RA, osteopenia, and GERD admitted on 06/11/2017 with hypercalcemia. Patient found to have pancreatic mas on abdominal US and bone scan revealed probable metastatic lesions throughout spine. Workup is ongoing and patient is pending MRI of the brain and abdomen today. Oncology is following. Palliative care was asked to help address goals of care.   Clinical Assessment and Goals of Care: Calcium and creatinine are improving. Patient remains weak and confused but denies active symptoms.   I met with patient's husband, who is her decision maker. He tells me that prior to this hospitalization patient was living at home with him and was mostly independent with all care. She required some assistance at times due to upper extremity weakness from previous rotator cuff problems. However, husband had seen patient decline over the previous two weeks with worsening weakness and confusion. She had fallen once at home recently.   Husband is able to relay to me in detail the current clinical problems and says he was told that patient's prognosis would likely be poor. He speaks candidly about the possibility that patient could be approaching end of life and that there might not be any viable treatment options going forward. He is interested in awaiting the results of imaging and speaking with oncology prior to any final decision making. However, we did discuss the option of hospice involvement in the event that it is felt that patient would be a poor treatment candidate. He says that he is comfortable with  making decisions and feels confident that he knows what patient would want.   We talked about code status. Husband says that patient would not want to be resuscitated or have her life prolonged artificially. He says she would want to be a DNR and he agrees with that decision.    SUMMARY OF RECOMMENDATIONS   1. DNR 2. Will follow and continue conversations with family after workup is complete 3. Disposition is unclear - would recommend hospice involvement in the event that patient is felt to be a poor candidate for treatment.      Primary Diagnoses: Present on Admission: . Hypercalcemia   I have reviewed the medical record, interviewed the patient and family, and examined the patient. The following aspects are pertinent.  Past Medical History:  Diagnosis Date  . ARTHRITIS, RHEUMATOID 11/29/2006  . Breast cancer (HCC)    left  . BURSITIS, SHOULDER 08/09/2008  . DCIS (ductal carcinoma in situ)   . DEPRESSION 02/24/2007  . DIVERTICULOSIS, COLON 02/24/2007  . External hemorrhoids   . GERD 02/24/2007  . Hypercalcemia   . OSTEOPENIA 11/29/2006  . Personal history of radiation therapy 02/1998   Social History   Socioeconomic History  . Marital   status: Married    Spouse name: Not on file  . Number of children: 1  . Years of education: Not on file  . Highest education level: Not on file  Occupational History  . Occupation: retired  Scientific laboratory technician  . Financial resource strain: Not on file  . Food insecurity:    Worry: Not on file    Inability: Not on file  . Transportation needs:    Medical: Not on file    Non-medical: Not on file  Tobacco Use  . Smoking status: Former Smoker    Last attempt to quit: 02/19/1978    Years since quitting: 39.3  . Smokeless tobacco: Never Used  Substance and Sexual Activity  . Alcohol use: No  . Drug use: No  . Sexual activity: Not on file  Lifestyle  . Physical activity:    Days per week: Not on file    Minutes per session: Not on file  .  Stress: Not on file  Relationships  . Social connections:    Talks on phone: Not on file    Gets together: Not on file    Attends religious service: Not on file    Active member of club or organization: Not on file    Attends meetings of clubs or organizations: Not on file    Relationship status: Not on file  Other Topics Concern  . Not on file  Social History Narrative   1 cup of coffee daily. Stopped 4 months ago   Family History  Problem Relation Age of Onset  . Lung cancer Mother   . Coronary artery disease Mother   . Prostate cancer Father   . Liver cancer Father   . Kidney failure Brother   . Coronary artery disease Brother   . Thyroid disease Sister    Scheduled Meds: . amLODipine  5 mg Oral Daily  . buPROPion  300 mg Oral Daily  . heparin injection (subcutaneous)  5,000 Units Subcutaneous Q8H  . potassium chloride  40 mEq Oral Once  . sucralfate  1 g Oral QID   Continuous Infusions: . potassium PHOSPHATE IVPB (mmol)     PRN Meds:.acetaminophen **OR** acetaminophen, hydrALAZINE, LORazepam, ondansetron, oxyCODONE-acetaminophen Medications Prior to Admission:  Prior to Admission medications   Medication Sig Start Date End Date Taking? Authorizing Provider  buPROPion (WELLBUTRIN XL) 300 MG 24 hr tablet Take 1 tablet (300 mg total) by mouth daily. 07/04/12  Yes Marletta Lor, MD  cephALEXin (KEFLEX) 500 MG capsule Take 500 mg by mouth 2 (two) times daily.  06/11/17  Yes [provider]  losartan (COZAAR) 100 MG tablet Take 100 mg by mouth daily.  06/03/17  Yes [provider]  sucralfate (CARAFATE) 1 G tablet TAKE 1 TABLET BY MOUTH 4 TIMES A DAY 01/17/13  Yes Marletta Lor, MD  ALPRAZolam Duanne Moron) 1 MG tablet TAKE 1 TABLET EVERY DAY as needed Patient not taking: Reported on 06/11/2017 07/04/12   Marletta Lor, MD  oxyCODONE-acetaminophen (PERCOCET/ROXICET) 5-325 MG per tablet Take 1 tablet by mouth every 4 (four) hours as  needed. Patient not taking: Reported on 06/11/2017 02/28/13   Larene Pickett, PA-C   Allergies  Allergen Reactions  . Amoxicillin Other (See Comments)    Severe yeast infections  . Reglan [Metoclopramide] Other (See Comments)    Chest pain   Review of Systems  Unable to perform ROS   Physical Exam  Constitutional:  NAD, frail appearing  Neck: Normal range of motion.  Neck supple.  Cardiovascular: Normal rate and regular rhythm.  Pulmonary/Chest: Effort normal and breath sounds normal.  Abdominal:  Firm, BS hypoactive  Neurological: She is alert.  Oriented x 1    Vital Signs: BP 139/63 (BP Location: Left Arm)   Pulse 92   Temp 98.5 F (36.9 C) (Oral)   Resp 18   Ht 5' 3" (1.6 m)   Wt 55.9 kg (123 lb 3.8 oz)   SpO2 92%   BMI 21.83 kg/m  Pain Scale: 0-10   Pain Score: 5    SpO2: SpO2: 92 % O2 Device:SpO2: 92 % O2 Flow Rate: .O2 Flow Rate (L/min): 2 L/min  IO: Intake/output summary:   Intake/Output Summary (Last 24 hours) at 06/15/2017 1016 Last data filed at 06/15/2017 0234 Gross per 24 hour  Intake 1086 ml  Output 1500 ml  Net -414 ml    LBM: Last BM Date: 06/10/17 Baseline Weight: Weight: 56.8 kg (125 lb 3.5 oz) Most recent weight: Weight: 55.9 kg (123 lb 3.8 oz)     Palliative Assessment/Data:   Flowsheet Rows     Most Recent Value  Intake Tab  Referral Department  Hospitalist  Unit at Time of Referral  Oncology Unit  Date Notified  06/13/17  Palliative Care Type  New Palliative care  Reason for referral  Clarify Goals of Care  Date of Admission  06/12/17  Date first seen by Palliative Care  06/15/17  # of days Palliative referral response time  2 Day(s)  # of days IP prior to Palliative referral  1  Clinical Assessment  Psychosocial & Spiritual Assessment  Palliative Care Outcomes  Patient/Family meeting held?  Yes  Who was at the meeting?  husband      Time In: 0930 Time Out: 1030 Time Total: 60 minutes Greater than 50%  of this time  was spent counseling and coordinating care related to the above assessment and plan.  Signed by: Irean Hong, NP   Please contact Palliative Medicine Team phone at [phone] for questions and concerns.  For individual provider: See Shea Evans

## 2017-06-15 NOTE — Progress Notes (Signed)
PROGRESS NOTE    DESMOND TUFANO  BPZ:[id number] DOB: 07/10/1932 DOA: 06/11/2017 PCP: Antony Contras, MD   Brief Narrative: [age] y[age]o.female,w h/o breast cancer (DCIS), Rheumatoid arthritis, Osteopenia, GERD who presents with hypercalcemia.  He was sent to the emergency room via PCP for hypercalcemia and was found to be having some altered mental status as well as encephalopathy. Pt was admitted for hypercalcemia and ARF and found to have a pancreatic lesion with likely metastasis.  Work-up was underway for pancreatic malignancy and Oncology is following. GI awaiting abdominal imaging before formally consultating or making further decisions.  Patient is to go under MRI imaging today.  Palliative care met with the patient and family today and will continue conversations after work-up is complete.  Mild to brain has been done however MRCP is currently being done and pending read     Assessment & Plan:   Principal Problem:   Hypercalcemia Active Problems:   Anemia   ARF (acute renal failure) (HCC)   Pancreatic mass   Palliative care encounter  Hypercalcemia likely of Malignancy -U/S abd showing pancreatic mass/ LAN, likely this is malignancy related,  -CT of the Chest/Abd/Pelvis with Contrast recommended; Will need to wait for Creatinine to improve as I discussed the case with Dr. Lebron Conners who feels that doing this study without contrast has little to no utility.  -Seen by Oncology Dr. Lebron Conners and appreciate further evaluation recommendations  - Vit D 25 and 1,25 were 32.2 and 190.0 respectively, ESR was 21, TSH was 2.821, myeloma, PTH was 12, PTH RP pending  -Ca2+ trended down from greater than 15 -> 9.9  today -IVF Rehydration Discontinued  -S/p Zolendronic Acid 93m iv x1 and likely probably contributing to the kidney dysfunction -Calcitonin therapy completed -Repeat CMP in AM   Abdominal Masses/ LAN -U/S abdomen showed:  "retroperitoneal mass lesions in the splenic hilar  region,adjacent to the pancreas, and possibly within the tail of the pancreas. These lesions likely represent enlarged lymph nodes or possibly primary tumors. Differential diagnosis would include metastasis, pancreatic carcinoma, and/or lymphoma. Suggest CT abdomen and pelvis for further evaluation" -Seen by Oncology Dr. PLebron Connersand appreciate further evaluation recommendations  -CT of the Chest/Abd/Pelvis with Contrast recommended; Will need to wait for Creatinine to improve as I discussed the case with Dr. PLebron Connerswho feels that doing this study without contrast has little to no utility.  -Dr. SJonnie Finnerdiscussed with ESadie HaberGI Dr. MWatt Climeswho said would need to see CT Abdomen or MRI of the Pancrease first before considering any diagnostic procedures such as EUS or IR Biopsy -Unable to do mammogram as inpatient -Bone Scan showed Multifocal areas of increased activity noted over the cervical, thoracic, and lumbar spine particularly over the lower left lumbar spine. Questionable areas of increased activity noted over anterior ribs bilaterally. These areas could represent metastatic disease -Because Cr still elevated will obtain MRCP w/o Contrast evaluate the Abdomen -MRI of the brain without contrast showed atrophy and small vessel disease.  No acute intracranial findings.  There is progression of atrophy since 2015. -MRCP has been done but is pending read. -Palliative care consulted and they will continue conversations with family after work-up is complete.  Palliative care feels will recommend hospice involvement in the event the patient is felt to be a poor candidate for treatment.  Acute Renal Failure -BUN/creatinine now trending down and went to 24/1.55 -Possibly worsened in the setting of Zometa and recent IV Lasix dosage with 80 mg every 8 hours  x2 doses -IV fluid hydration has now stopped given the patient appeared volume overloaded 06/13/17 -Changed Enoxaparin 30 mg subcu every 24 hours to Heparin  5000 units subcu q8h  -Avoid Nephrotoxic Medications if possible continue to hold losartan 100 mg p.o. daily -Repeat CMP CMP in the AM.  Abnormal AST -Hep B/ C Negative -Normal liver on [place] -Patient's AST went from 65 and is now 75 has remained elevated -Continue to monitor and repeat CMP in a.m.  Hypertension -Continue to Hold Losartan due to ARF -Amlodipine 5 mg daily added -Blood pressure is adequately controlled today was 131/83 -C/w Hydralazine 10 mg IV q6h prn sbp >160  Normocytic Anemia -Hb/Hct went from 9.7/29.8 to 8.8/26.8 -Check Anemia Panel in AM  -Continue to monitor for signs and symptoms of bleeding -Repeat CBC in the a.m.  Rheumatoid Arthritis -Continue with Oxycodone-acetaminophen 5-325 mg/tab. 1 tablet every 4 hours as needed for severe pain  Depression and Anxiety  -Continue with Wellbutrin 300 mg 24-hour tablet p.o. Daily -Home alprazolam 1 mg p.o. daily RN held  Pyuria -Abx with Keflex Discontinued,  -Urine Cx showed < 10K col  Acute Metabolic Encephalopathy -Likely Secondary due to hypercalcemia and volume depletion  -MRI of the brain without contrast showed atrophy and small vessel disease.  No acute intracranial findings.  There is progression of atrophy since 2015. -Continue to monitor very closely  Hypomagnesemia -She has magnesium level this a.m. was 1.1 and yesterday was 1.2 -Replete with IV mag sulfate 3 g  -Continue to monitor and replete as necessary  -Repeat Mag level in a.m.  Hypokalemia -Patient's potassium level exam was 2.9 improved to 3.6 -Replete with 40 mEq p.o. twice daily as well as IV 30 mEq KCl yesterday -Continue to monitor and replete as necessary -Repeat-CMP in the a.m.  Hypophosphatemia -Patient's phosphorus level this morning was 1.4 -Replete with IV K-Phos 30 mmol -Continue to monitor and replete as necessary -Repeat phosphorus level in the morning  Hypomagnesemia -Patient magnesium level this morning was  1.6 -Replete with IV mag sulfate 2 g -Continue to monitor and replete as necessary -Repeat mag level in the a.m.  History of Breast Cancer -Malignancy Workup underway for pancreatic lesion -Will need outpatient mammogram  DVT prophylaxis: Heparin 5000 units subcu every 8 hours Code Status: FULL CODE Family Communication: Discussed with husband at bedside Disposition Plan: Remain inpatient at this time  Consultants:   Medical Oncology  Case was Discussed with Gastroenterology   Procedures: None  Antimicrobials:  Anti-infectives (From admission, onward)   Start     Dose/Rate Route Frequency Ordered Stop   06/12/17 2200  cephALEXin (KEFLEX) capsule 250 mg  Status:  Discontinued     250 mg Oral 2 times daily 06/12/17 1415 06/14/17 1004   06/12/17 1000  cephALEXin (KEFLEX) capsule 500 mg  Status:  Discontinued     500 mg Oral 2 times daily 06/12/17 0751 06/12/17 1415     Subjective: Seen and examined at bedside and her mentation was much improved than yesterday. She did have some slight left-sided abdominal pain. Chest pain, shortness breath, nausea, vomiting.  No other complaints or concerns at time.  Objective: Vitals:   06/15/17 0500 06/15/17 0524 06/15/17 0604 06/15/17 1419  BP:  139/63  131/83  Pulse:  89 92 89  Resp:  18  12  Temp:  98.5 F (36.9 C)  98 F (36.7 C)  TempSrc:  Oral  Oral  SpO2:  100% 92% 94%  Weight: 55.9 kg (  123 lb 3.8 oz)     Height:        Intake/Output Summary (Last 24 hours) at 06/15/2017 1807 Last data filed at 06/15/2017 1600 Gross per 24 hour  Intake 480 ml  Output 300 ml  Net 180 ml   Filed Weights   06/13/17 0540 06/14/17 0513 06/15/17 0500  Weight: 58 kg (127 lb 12.8 oz) 55.4 kg (122 lb 2.2 oz) 55.9 kg (123 lb 3.8 oz)   Examination: Physical Exam:  Constitutional: Thin elderly Caucasian female who is currently in no acute distress.  Appears calm and comfortable sitting up in bed Eyes: There are anicteric.  Lids and conjunctive  are normal. ENMT: External ears and nose appear normal.  Grossly normal hearing Neck: Supple with no JVD Respiratory: Diminished to auscultation bilaterally with unlabored breathing.  Patient was not tachypneic and there is no appreciable wheezing, rales, rhonchi. Cardiovascular: Regular rate and rhythm.  Has a 2 out of 6 systolic murmur.  Patient had no extremity edema Abdomen: Soft, mildly tender in the left upper quadrant, nondistended.  Bowel sounds present all 4 quadrants GU: Deferred Musculoskeletal: No contractures, or cyanosis.  No joint deformities noted Skin: No appreciable rashes, lesions on a limited skin evaluation.  Patient skin is warm dry Neurologic: Cranial nerves II through XII grossly intact with no appreciable focal deficits. Psychiatric: Normal mood and affect.  Intact judgment and insight today.  Patient is awake and alert and oriented x3  Data Reviewed: I have personally reviewed following labs and imaging studies  CBC: Recent Labs  Lab 06/11/17 2249 06/12/17 1028 06/13/17 0439 06/14/17 0523 06/15/17 0456  WBC 4.9 4.7 5.6 9.3 7.8  NEUTROABS 3.7  --   --   --  6.4  HGB 10.9* 9.7* 9.0* 9.7* 8.8*  HCT 33.1* 29.3* 26.7* 29.8* 26.8*  MCV 88.3 88.3 87.8 87.6 87.0  PLT 324 301 299 293 878   Basic Metabolic Panel: Recent Labs  Lab 06/11/17 2249 06/12/17 1028 06/13/17 0439 06/14/17 0523 06/15/17 0456  NA 132* 131*  134* 134* 135 132*  K 4.1 3.5  3.6 3.3* 2.9* 3.6  CL 94* 98*  99* 103 98* 96*  CO2 _0 21* 23 23  GLUCOSE 95 99  103* 96 100* 88  BUN 26* 22*  22* 19 22* 24*  CREATININE 2.05* 1.77*  1.78* 1.52* 1.78* 1.55*  CALCIUM >15.0* 13.4*  13.6* 11.6* 11.2* 9.9  MG  --   --  1.2* 1.1* 1.6*  PHOS  --  3.2 2.7 2.8  2.9 1.4*   GFR: Estimated Creatinine Clearance: 23.1 mL/min (A) (by C-G formula based on SCr of 1.55 mg/dL (H)). Liver Function Tests: Recent Labs  Lab 06/11/17 2249 06/12/17 1028 06/13/17 0439 06/14/17 0523 06/15/17 0456   AST 74* 65*  --   --  75*  ALT 28 23  --   --  20  ALKPHOS 93 77  --   --  71  BILITOT 0.5 0.5  --   --  0.5  PROT 7.6 6.4*  --   --  5.8*  ALBUMIN 4.1 3.4*  3.6 3.2* 3.6 3.3*   No results for input(s): LIPASE, AMYLASE in the last 168 hours. No results for input(s): AMMONIA in the last 168 hours. Coagulation Profile: No results for input(s): INR, PROTIME in the last 168 hours. Cardiac Enzymes: No results for input(s): CKTOTAL, CKMB, CKMBINDEX, TROPONINI in the last 168 hours. BNP (last 3 results) No results for input(s):  PROBNP in the last 8760 hours. HbA1C: No results for input(s): HGBA1C in the last 72 hours. CBG: No results for input(s): GLUCAP in the last 168 hours. Lipid Profile: No results for input(s): CHOL, HDL, LDLCALC, TRIG, CHOLHDL, LDLDIRECT in the last 72 hours. Thyroid Function Tests: No results for input(s): TSH, T4TOTAL, FREET4, T3FREE, THYROIDAB in the last 72 hours. Anemia Panel: No results for input(s): VITAMINB12, FOLATE, FERRITIN, TIBC, IRON, RETICCTPCT in the last 72 hours. Sepsis Labs: No results for input(s): PROCALCITON, LATICACIDVEN in the last 168 hours.  Recent Results (from the past 240 hour(s))  Urine culture     Status: Abnormal   Collection Time: 06/12/17 12:00 AM  Result Value Ref Range Status   Specimen Description   Final    URINE, CLEAN CATCH Performed at [facility], [place] [address] [place], [place] [address]    Special Requests   Final    NONE Performed at [facility], [place] [address], [place], [place] [address]    Culture (A)  Final    <10,000 COLONIES/mL INSIGNIFICANT GROWTH Performed at [facility] [address], [place], [place] [address]    Report Status 06/13/2017 FINAL  Final     Radiology Studies: Mr Brain Wo Contrast  Result Date: 06/15/2017 CLINICAL DATA:  Encephalopathy, and altered level of consciousness. History of breast cancer. EXAM: MRI HEAD WITHOUT CONTRAST  TECHNIQUE: Multiplanar, multiecho pulse sequences of the brain and surrounding structures were obtained without intravenous contrast. COMPARISON:  CT head 02/28/2013. FINDINGS: Brain: No evidence for acute infarction, hemorrhage, mass lesion, hydrocephalus, or extra-axial fluid. Advanced atrophy. Mild to moderate subcortical and periventricular T2 and FLAIR hyperintensities, likely chronic microvascular ischemic change. Vascular: Flow voids are maintained throughout the carotid, basilar, and vertebral arteries. There are no areas of chronic hemorrhage. Skull and upper cervical spine: Normal marrow signal. Cervical spondylosis. Sinuses/Orbits: Negative. Other: None. Compared with prior CT, there is progression of atrophy. IMPRESSION: Atrophy and small vessel disease.  No acute intracranial findings. Progression of atrophy since 2015. Electronically Signed   By: Staci Righter M.D.   On: 06/15/2017 14:24   Scheduled Meds: . amLODipine  5 mg Oral Daily  . buPROPion  300 mg Oral Daily  . heparin injection (subcutaneous)  5,000 Units Subcutaneous Q8H  . sucralfate  1 g Oral QID   Continuous Infusions:   LOS: 3 days   Kerney Elbe, DO [facility] Pager [phone]  If 7PM-7AM, please contact night-coverage [email or web address] Password [facility] 06/15/2017, 6:07 PM

## 2017-06-16 LAB — RETICULOCYTES
RBC.: 3.1 MIL/uL — ABNORMAL LOW (ref 3.87–5.11)
RETIC CT PCT: 1.3 % (ref 0.4–3.1)
Retic Count, Absolute: 40.3 10*3/uL (ref 19.0–186.0)

## 2017-06-16 LAB — CBC WITH DIFFERENTIAL/PLATELET
Basophils Absolute: 0 10*3/uL (ref 0.0–0.1)
Basophils Relative: 0 %
EOS PCT: 2 %
Eosinophils Absolute: 0.1 10*3/uL (ref 0.0–0.7)
HCT: 26.8 % — ABNORMAL LOW (ref 36.0–46.0)
Hemoglobin: 9 g/dL — ABNORMAL LOW (ref 12.0–15.0)
LYMPHS ABS: 0.7 10*3/uL (ref 0.7–4.0)
Lymphocytes Relative: 12 %
MCH: 29 pg (ref 26.0–34.0)
MCHC: 33.6 g/dL (ref 30.0–36.0)
MCV: 86.5 fL (ref 78.0–100.0)
MONO ABS: 0.5 10*3/uL (ref 0.1–1.0)
MONOS PCT: 9 %
Neutro Abs: 4.4 10*3/uL (ref 1.7–7.7)
Neutrophils Relative %: 77 %
PLATELETS: 275 10*3/uL (ref 150–400)
RBC: 3.1 MIL/uL — AB (ref 3.87–5.11)
RDW: 13.7 % (ref 11.5–15.5)
WBC: 5.8 10*3/uL (ref 4.0–10.5)

## 2017-06-16 LAB — COMPREHENSIVE METABOLIC PANEL
ALT: 21 U/L (ref 14–54)
ANION GAP: 11 (ref 5–15)
AST: 74 U/L — ABNORMAL HIGH (ref 15–41)
Albumin: 3.1 g/dL — ABNORMAL LOW (ref 3.5–5.0)
Alkaline Phosphatase: 60 U/L (ref 38–126)
BUN: 24 mg/dL — ABNORMAL HIGH (ref 6–20)
CHLORIDE: 96 mmol/L — AB (ref 101–111)
CO2: 23 mmol/L (ref 22–32)
CREATININE: 1.35 mg/dL — AB (ref 0.44–1.00)
Calcium: 9.1 mg/dL (ref 8.9–10.3)
GFR, EST AFRICAN AMERICAN: 41 mL/min — AB (ref >60)
GFR, EST NON AFRICAN AMERICAN: 35 mL/min — AB (ref >60)
Glucose, Bld: 77 mg/dL (ref 65–99)
Potassium: 3.4 mmol/L — ABNORMAL LOW (ref 3.5–5.1)
SODIUM: 130 mmol/L — AB (ref 135–145)
Total Bilirubin: 0.6 mg/dL (ref 0.3–1.2)
Total Protein: 5.8 g/dL — ABNORMAL LOW (ref 6.5–8.1)

## 2017-06-16 LAB — FOLATE: Folate: 13.8 ng/mL (ref >5.9)

## 2017-06-16 LAB — IRON AND TIBC
Iron: 34 ug/dL (ref 28–170)
SATURATION RATIOS: 11 % (ref 10.4–31.8)
TIBC: 297 ug/dL (ref 250–450)
UIBC: 263 ug/dL

## 2017-06-16 LAB — PHOSPHORUS: PHOSPHORUS: 2.2 mg/dL — AB (ref 2.5–4.6)

## 2017-06-16 LAB — MAGNESIUM: MAGNESIUM: 1.8 mg/dL (ref 1.7–2.4)

## 2017-06-16 LAB — VITAMIN B12: Vitamin B-12: 793 pg/mL (ref 180–914)

## 2017-06-16 LAB — FERRITIN: FERRITIN: 150 ng/mL (ref 11–307)

## 2017-06-16 MED ORDER — HALOPERIDOL LACTATE 5 MG/ML IJ SOLN
5.0000 mg | Freq: Once | INTRAMUSCULAR | Status: AC
  Administered 2017-06-16: 5 mg via INTRAVENOUS
  Filled 2017-06-16: qty 1

## 2017-06-16 MED ORDER — DEXTROSE 5 % IV SOLN
20.0000 mmol | Freq: Once | INTRAVENOUS | Status: AC
  Administered 2017-06-16: 20 mmol via INTRAVENOUS
  Filled 2017-06-16: qty 6.67

## 2017-06-16 MED ORDER — POTASSIUM CHLORIDE CRYS ER 20 MEQ PO TBCR
40.0000 meq | EXTENDED_RELEASE_TABLET | Freq: Once | ORAL | Status: AC
  Administered 2017-06-16: 40 meq via ORAL
  Filled 2017-06-16: qty 2

## 2017-06-16 NOTE — Progress Notes (Signed)
MRI of the abdomen and MRCP show a mild biliary dilatation with no evidence of choledocholithiasis, no pancreatic mass, normal spleen.  Bulky lymphadenopathies identified in upper abdomen within gastrohepatic and gastrosplenic ligament, left cardiophrenic angle and porta hepatis and throughout retroperitoneum suspicious for lymphoma.  Discussed with patient's hospitalist Dr. Alfredia [place].  Endoscopic ultrasound not indicated.  Patient may benefit from IR guided biopsy of lymph nodes.  Please recall GI if we can be of any assistance.  Ronnette Juniper, M.D.

## 2017-06-16 NOTE — Progress Notes (Signed)
Pt began shift confused and attempting to get oob. Once family arrived pt as been less confused and not attempting to get oob. Cont with plan of care. Able to redirect with verbal cues. No need for  Medications for confusion during my shift.  Report given to oncoming RN. Cont with plan of care

## 2017-06-16 NOTE — Progress Notes (Signed)
PROGRESS NOTE    Monique Crosby  MVH:[id number] DOB: 04-Mar-1932 DOA: 06/11/2017 PCP: Antony Contras, MD   Brief Narrative: Monique Crosby y.o.female,w h/o breast cancer (DCIS), Rheumatoid arthritis, Osteopenia, GERD who presents with hypercalcemia. She was sent to the emergency room via PCP for hypercalcemia and was found to be having some altered mental status as well as encephalopathy.   Pt was admitted for hypercalcemia and ARF and found to have Abdominal Masses and ? pancreatic lesion with likely metastasis.  Work-up was underway for suspected pancreatic malignancy and Oncology is following.    Palliative care met with the patient and family and will continue conversations after work-up is complete.  MRI brain and MRCP Done. MRI Brain showed atrophy and small vessel disease and no acute intracranial findings. MRCP showed Bulky upper abdominal and retroperitoneal lymphadenopathy, suspicious for lymphoma with metastatic disease considered less likely. Case was discussed with Gastroenterology who feel there is no Endoscopic Value and recommended IR Biopsy.  IR consulted for LN Biopsy for Tissue Diagnosis.      Assessment & Plan:   Principal Problem:   Hypercalcemia Active Problems:   Anemia   ARF (acute renal failure) (HCC)   Pancreatic mass   Palliative care encounter  Hypercalcemia likely of Malignancy -U/S abd showing pancreatic mass/ LAN, likely this is malignancy related,  -CT of the Chest/Abd/Pelvis with Contrast recommended; Will need to wait for Creatinine to improve as I discussed the case with Dr. Lebron Conners who feels that doing this study without contrast has little to no utility.  -Seen by Oncology Dr. Lebron Conners and appreciate further evaluation recommendations  - Vit D 25 and 1,25 were 32.2 and 190.0 respectively, ESR was 21, TSH was 2.821, myeloma, PTH was 12, PTH RP pending  -Ca2+ trended down from greater than 15 -> 9.1  today -IVF Rehydration Discontinued  -S/p  Zolendronic Acid 48m iv x1 and likely probably contributing to the kidney dysfunction -Calcitonin therapy completed -Repeat CMP in AM   Abdominal Masses/ LAN suspected Lymphoma -U/S abdomen showed:  "Retroperitoneal mass lesions in the splenic hilar region,adjacent to the pancreas, and possibly within the tail of the pancreas. These lesions likely represent enlarged lymph nodes or possibly primary tumors. Differential diagnosis would include metastasis, pancreatic carcinoma, and/or lymphoma. Suggest CT abdomen and pelvis for further evaluation" -Seen by Oncology Dr. PLebron Connersand appreciate further evaluation recommendations  -CT of the Chest/Abd/Pelvis with Contrast recommended; Will need to wait for Creatinine to improve as I discussed the case with Dr. PLebron Connerswho feels that doing this study without contrast has little to no utility.  -Dr. SJonnie Finnerdiscussed with ESadie HaberGI Dr. MWatt Climeswho said would need to see CT Abdomen or MRI of the Pancrease first before considering any diagnostic procedures such as EUS or IR Biopsy -Unable to do mammogram as inpatient -Bone Scan showed Multifocal areas of increased activity noted over the cervical, thoracic, and lumbar spine particularly over the lower left lumbar spine. Questionable areas of increased activity noted over anterior ribs bilaterally. These areas could represent metastatic disease -Because Cr still elevated obtained MRCP w/o Contrast to evaluate the Abdomen; MRCP showed Bulky upper abdominal and retroperitoneal lymphadenopathy, suspicious for lymphoma with metastatic disease considered less likely. No evidence of hydronephrosis. -MRI of the brain without contrast showed atrophy and small vessel disease.  No acute intracranial findings.  There is progression of atrophy since 2015. -IR To evaluate for LN biopsy  -Palliative care consulted and they will continue conversations with family after work-up  is complete.  Palliative care feels will recommend  hospice involvement in the event the patient is felt to be a poor candidate for treatment.  Acute Renal Failure, improving -BUN/creatinine now trending down and went to 24/1.35 -Possibly worsened in the setting of Zometa and recent IV Lasix dosage with 80 mg every 8 hours x2 doses -IV fluid hydration has now stopped given the patient appeared volume overloaded 06/13/17 -Changed Enoxaparin 30 mg subcu every 24 hours to Heparin 5000 units subcu q8h  -Avoid Nephrotoxic Medications if possible continue to hold losartan 100 mg p.o. daily -Repeat CMP CMP in the AM.  Abnormal AST -Hep B/ C Negative -Normal liver on [place] -Patient's AST remains slightly elevated and is 74 -Continue to monitor and repeat CMP in a.m.  Hypertension -Continue to Hold Losartan due to ARF -Amlodipine 5 mg daily added and BP better controlled  -Blood pressure is adequately controlled today was 119/66 -C/w Hydralazine 10 mg IV q6h prn sbp >160  Normocytic Anemia -Hb/Hct now 9.0/26.8 -Checked Anemia Panel this AM showed iron level of 34, UIBC of 263, TIBC of 297, saturation ratios of 11%, ferritin level of 150, folate level of 13.8, and a vitamin B12 of 793 -Continue to monitor for signs and symptoms of bleeding -Repeat CBC in the AM  Rheumatoid Arthritis -Continue with Oxycodone-acetaminophen 5-325 mg/tab. 1 tablet every 4 hours as needed for severe pain  Depression and Anxiety  -Continue with Wellbutrin 300 mg 24-hour tablet p.o. Daily -Home alprazolam 1 mg p.o. daily RN held  Pyuria -Abx with Keflex Discontinued,  -Urine Cx showed < 10K col  Acute Metabolic Encephalopathy, improved  -Likely Secondary due to hypercalcemia and volume depletion and now significantly improved -MRI of the brain without contrast showed atrophy and small vessel disease.  No acute intracranial findings.  There is progression of atrophy since 2015. -Continue to monitor very closely  Hypokalemia -Patient's potassium level  exam was 3.4 -Replete IV 20 mEq KCl and 30 mEq of potassium chloride x1 -Continue to monitor and replete as necessary -Repeat-CMP in the a.m.  Hypophosphatemia -Patient's phosphorus level this morning was 2.2 -Replete with IV K-Phos 20 mmol -Continue to monitor and replete as necessary -Repeat phosphorus level in the morning  Hypomagnesemia -Patient magnesium level this morning was 1.8 -Replete with IV mag sulfate 2 g yesterday -Continue to monitor and replete as necessary -Repeat mag level in the a.m.  Hyponatremia -Patient is sodium level as it was 130 -Due to monitor repeat CMP in a.m. -May consider restarting low-dose IV fluid if not improving  History of Breast Cancer -Malignancy Workup underway for ?pancreatic lesion -Will need outpatient mammogram  DVT prophylaxis: Heparin 5000 units subcu every 8 hours Code Status: FULL CODE Family Communication: Discussed with husband at bedside Disposition Plan: Remain inpatient at this time  Consultants:   Medical Oncology  Case was Discussed with Gastroenterology  IR consult placed   Procedures: MRI's as above  Antimicrobials:  Anti-infectives (From admission, onward)   Start     Dose/Rate Route Frequency Ordered Stop   06/12/17 2200  cephALEXin (KEFLEX) capsule 250 mg  Status:  Discontinued     250 mg Oral 2 times daily 06/12/17 1415 06/14/17 1004   06/12/17 1000  cephALEXin (KEFLEX) capsule 500 mg  Status:  Discontinued     500 mg Oral 2 times daily 06/12/17 0751 06/12/17 1415     Subjective: Seen and examined at bedside she was happy to find out that her calcium level was  9.1 today.  They feel that her mentation is much improved.  Patient does complain of some mild abdominal pain today.  No chest pain, shortness of breath, nausea, vomiting.  Objective: Vitals:   06/15/17 2133 06/16/17 0500 06/16/17 0552 06/16/17 1441  BP: 128/78  119/64 119/66  Pulse: 88  88 89  Resp: 20  18 16   Temp: 98.4 F (36.9 C)  98.7  F (37.1 C) 99 F (37.2 C)  TempSrc:   Oral Oral  SpO2: 95%  94% 94%  Weight:  56.2 kg (123 lb 14.4 oz)    Height:        Intake/Output Summary (Last 24 hours) at 06/16/2017 1822 Last data filed at 06/16/2017 1400 Gross per 24 hour  Intake 220 ml  Output 800 ml  Net -580 ml   Filed Weights   06/14/17 0513 06/15/17 0500 06/16/17 0500  Weight: 55.4 kg (122 lb 2.2 oz) 55.9 kg (123 lb 3.8 oz) 56.2 kg (123 lb 14.4 oz)   Examination: Physical Exam:  Constitutional: Thin elderly Caucasian female who is currently in no acute distress.  Appears calm and comfortable sitting in bedside talking with family Eyes: Sclera anicteric.  Lids normal and conjunctive are noninjected. ENMT: External ears and nose appear normal.  Grossly normal hearing. Neck: Supple with no JVD Respiratory: Diminished to auscultation bilaterally with unlabored breathing.  Patient is not tachypneic or using accessory muscles to breathe.  No appreciable wheezing, rales, rhonchi. Cardiovascular: Regular rate and rhythm.  Is a 2 out of 6 systolic murmur.  Has no lower extremity edema Abdomen: Soft, mildly tender to palpate in the mid abdomen.  Nondistended.  Bowel sounds present all 4 quadrants GU: Deferred Musculoskeletal: No contractures, no cyanosis.  No joint deformities noted Skin: Skin is warm and dry with no appreciable rashes or lesions on limited skin evaluation. Neurologic: Cranial nerves II through XII grossly intact with no appreciable focal deficits Psychiatric: Normal mood and affect.  Intact judgment and insight.  Patient is awake, alert, and oriented x3  Data Reviewed: I have personally reviewed following labs and imaging studies  CBC: Recent Labs  Lab 06/11/17 2249 06/12/17 1028 06/13/17 0439 06/14/17 0523 06/15/17 0456 06/16/17 0555  WBC 4.9 4.7 5.6 9.3 7.8 5.8  NEUTROABS 3.7  --   --   --  6.4 4.4  HGB 10.9* 9.7* 9.0* 9.7* 8.8* 9.0*  HCT 33.1* 29.3* 26.7* 29.8* 26.8* 26.8*  MCV 88.3 88.3 87.8  87.6 87.0 86.5  PLT 324 301 299 293 295 073   Basic Metabolic Panel: Recent Labs  Lab 06/12/17 1028 06/13/17 0439 06/14/17 0523 06/15/17 0456 06/16/17 0555  NA 131*  134* 134* 135 132* 130*  K 3.5  3.6 3.3* 2.9* 3.6 3.4*  CL 98*  99* 103 98* 96* 96*  CO2 24  26 21* 23 23 23   GLUCOSE 99  103* 96 100* 88 77  BUN 22*  22* 19 22* 24* 24*  CREATININE 1.77*  1.78* 1.52* 1.78* 1.55* 1.35*  CALCIUM 13.4*  13.6* 11.6* 11.2* 9.9 9.1  MG  --  1.2* 1.1* 1.6* 1.8  PHOS 3.2 2.7 2.8  2.9 1.4* 2.2*   GFR: Estimated Creatinine Clearance: 26.6 mL/min (A) (by C-G formula based on SCr of 1.35 mg/dL (H)). Liver Function Tests: Recent Labs  Lab 06/11/17 2249 06/12/17 1028 06/13/17 0439 06/14/17 0523 06/15/17 0456 06/16/17 0555  AST 74* 65*  --   --  75* 74*  ALT 28 23  --   --  20 21  ALKPHOS 93 77  --   --  71 60  BILITOT 0.5 0.5  --   --  0.5 0.6  PROT 7.6 6.4*  --   --  5.8* 5.8*  ALBUMIN 4.1 3.4*  3.6 3.2* 3.6 3.3* 3.1*   No results for input(s): LIPASE, AMYLASE in the last 168 hours. No results for input(s): AMMONIA in the last 168 hours. Coagulation Profile: No results for input(s): INR, PROTIME in the last 168 hours. Cardiac Enzymes: No results for input(s): CKTOTAL, CKMB, CKMBINDEX, TROPONINI in the last 168 hours. BNP (last 3 results) No results for input(s): PROBNP in the last 8760 hours. HbA1C: No results for input(s): HGBA1C in the last 72 hours. CBG: No results for input(s): GLUCAP in the last 168 hours. Lipid Profile: No results for input(s): CHOL, HDL, LDLCALC, TRIG, CHOLHDL, LDLDIRECT in the last 72 hours. Thyroid Function Tests: No results for input(s): TSH, T4TOTAL, FREET4, T3FREE, THYROIDAB in the last 72 hours. Anemia Panel: Recent Labs    06/16/17 0555  VITAMINB12 793  FOLATE 13.8  FERRITIN 150  TIBC 297  IRON 34  RETICCTPCT 1.3   Sepsis Labs: No results for input(s): PROCALCITON, LATICACIDVEN in the last 168 hours.  Recent Results (from  the past 240 hour(s))  Urine culture     Status: Abnormal   Collection Time: 06/12/17 12:00 AM  Result Value Ref Range Status   Specimen Description   Final    URINE, CLEAN CATCH Performed at [facility], [place] [address], [place], [place] [address]    Special Requests   Final    NONE Performed at [facility], [place] [address] [place], [place] [address]    Culture (A)  Final    <10,000 COLONIES/mL INSIGNIFICANT GROWTH Performed at [place] [address] [place], [place] [address]    Report Status 06/13/2017 FINAL  Final    Radiology Studies: Mr Brain Wo Contrast  Result Date: 06/15/2017 CLINICAL DATA:  Encephalopathy, and altered level of consciousness. History of breast cancer. EXAM: MRI HEAD WITHOUT CONTRAST TECHNIQUE: Multiplanar, multiecho pulse sequences of the brain and surrounding structures were obtained without intravenous contrast. COMPARISON:  CT head 02/28/2013. FINDINGS: Brain: No evidence for acute infarction, hemorrhage, mass lesion, hydrocephalus, or extra-axial fluid. Advanced atrophy. Mild to moderate subcortical and periventricular T2 and FLAIR hyperintensities, likely chronic microvascular ischemic change. Vascular: Flow voids are maintained throughout the carotid, basilar, and vertebral arteries. There are no areas of chronic hemorrhage. Skull and upper cervical spine: Normal marrow signal. Cervical spondylosis. Sinuses/Orbits: Negative. Other: None. Compared with prior CT, there is progression of atrophy. IMPRESSION: Atrophy and small vessel disease.  No acute intracranial findings. Progression of atrophy since 2015. Electronically Signed   By: Staci Righter M.D.   On: 06/15/2017 14:24   Mr Abdomen Mrcp Wo Contrast  Result Date: 06/16/2017 CLINICAL DATA:  Retroperitoneal mass on recent ultrasound. Renal insufficiency. Personal history of left breast carcinoma. EXAM: MRI ABDOMEN WITHOUT CONTRAST  (INCLUDING MRCP)  TECHNIQUE: Multiplanar multisequence MR imaging of the abdomen was performed. Heavily T2-weighted images of the biliary and pancreatic ducts were obtained, and three-dimensional MRCP images were rendered by post processing. COMPARISON:  Ultrasound on 06/12/2017 FINDINGS: Image degradation by respiratory motion artifact noted. Lower chest: Small bilateral pleural effusions bibasilar atelectasis. Hepatobiliary: No masses visualized on this unenhanced exam. Prior cholecystectomy. Mild biliary ductal dilatation is noted, however no evidence of choledocholithiasis or other obstructing etiology. This likely related to  prior cholecystectomy. Pancreas: No definite pancreatic mass on this unenhanced exam. No evidence of pancreatic ductal dilatation or pancreas divisum. Spleen:  Within normal limits in size. Adrenals/Urinary tract: Unremarkable. No evidence of hydronephrosis. Stomach/Bowel: Visualized portion unremarkable. Vascular/Lymphatic: Bulky lymphadenopathy is seen in the upper abdomen within the gastrohepatic and gastrosplenic ligaments, left cardiophrenic angle, and porta hepatis. Bulky lymphadenopathy is also seen throughout the abdominal retroperitoneum. Index area in the retrocaval space measures 3.3 cm in short axis on image 31/10. Other: Minimal perihepatic ascites and mild diffuse mesenteric and abdominal wall edema. Musculoskeletal:  No suspicious bone lesions identified. IMPRESSION: Bulky upper abdominal and retroperitoneal lymphadenopathy, suspicious for lymphoma with metastatic disease considered less likely. No evidence of hydronephrosis. Electronically Signed   By: Earle Gell M.D.   On: 06/16/2017 08:15   Scheduled Meds: . amLODipine  5 mg Oral Daily  . buPROPion  300 mg Oral Daily  . heparin injection (subcutaneous)  5,000 Units Subcutaneous Q8H  . sucralfate  1 g Oral QID   Continuous Infusions: . potassium PHOSPHATE IVPB (mmol) 20 mmol (06/16/17 1245)    LOS: 4 days   Kerney Elbe,  DO [facility] Pager [phone]  If 7PM-7AM, please contact night-coverage [email or web address] Password [facility] 06/16/2017, 6:22 PM

## 2017-06-17 LAB — COMPREHENSIVE METABOLIC PANEL
ALBUMIN: 3.1 g/dL — AB (ref 3.5–5.0)
ALK PHOS: 72 U/L (ref 38–126)
ALT: 21 U/L (ref 14–54)
AST: 73 U/L — AB (ref 15–41)
Anion gap: 11 (ref 5–15)
BILIRUBIN TOTAL: 0.5 mg/dL (ref 0.3–1.2)
BUN: 26 mg/dL — ABNORMAL HIGH (ref 6–20)
CALCIUM: 9.2 mg/dL (ref 8.9–10.3)
CO2: 21 mmol/L — AB (ref 22–32)
Chloride: 96 mmol/L — ABNORMAL LOW (ref 101–111)
Creatinine, Ser: 1.38 mg/dL — ABNORMAL HIGH (ref 0.44–1.00)
GFR calc Af Amer: 40 mL/min — ABNORMAL LOW (ref >60)
GFR calc non Af Amer: 35 mL/min — ABNORMAL LOW (ref >60)
GLUCOSE: 90 mg/dL (ref 65–99)
Potassium: 4.3 mmol/L (ref 3.5–5.1)
Sodium: 128 mmol/L — ABNORMAL LOW (ref 135–145)
TOTAL PROTEIN: 5.7 g/dL — AB (ref 6.5–8.1)

## 2017-06-17 LAB — CBC WITH DIFFERENTIAL/PLATELET
BASOS ABS: 0 10*3/uL (ref 0.0–0.1)
BASOS PCT: 0 %
Eosinophils Absolute: 0.2 10*3/uL (ref 0.0–0.7)
Eosinophils Relative: 3 %
HEMATOCRIT: 29.2 % — AB (ref 36.0–46.0)
HEMOGLOBIN: 9.6 g/dL — AB (ref 12.0–15.0)
Lymphocytes Relative: 13 %
Lymphs Abs: 0.8 10*3/uL (ref 0.7–4.0)
MCH: 28.4 pg (ref 26.0–34.0)
MCHC: 32.9 g/dL (ref 30.0–36.0)
MCV: 86.4 fL (ref 78.0–100.0)
MONOS PCT: 6 %
Monocytes Absolute: 0.4 10*3/uL (ref 0.1–1.0)
NEUTROS ABS: 5.1 10*3/uL (ref 1.7–7.7)
NEUTROS PCT: 78 %
Platelets: 306 10*3/uL (ref 150–400)
RBC: 3.38 MIL/uL — AB (ref 3.87–5.11)
RDW: 13.8 % (ref 11.5–15.5)
WBC: 6.5 10*3/uL (ref 4.0–10.5)

## 2017-06-17 LAB — MULTIPLE MYELOMA PANEL, SERUM
ALBUMIN/GLOB SERPL: 1.3 (ref 0.7–1.7)
ALPHA 1: 0.3 g/dL (ref 0.0–0.4)
Albumin SerPl Elph-Mcnc: 3.1 g/dL (ref 2.9–4.4)
Alpha2 Glob SerPl Elph-Mcnc: 0.7 g/dL (ref 0.4–1.0)
B-Globulin SerPl Elph-Mcnc: 0.8 g/dL (ref 0.7–1.3)
Gamma Glob SerPl Elph-Mcnc: 0.7 g/dL (ref 0.4–1.8)
Globulin, Total: 2.5 g/dL (ref 2.2–3.9)
IGA: 144 mg/dL (ref 64–422)
IGM (IMMUNOGLOBULIN M), SRM: 189 mg/dL (ref 26–217)
IgG (Immunoglobin G), Serum: 654 mg/dL — ABNORMAL LOW (ref 700–1600)
Total Protein ELP: 5.6 g/dL — ABNORMAL LOW (ref 6.0–8.5)

## 2017-06-17 LAB — PROTIME-INR
INR: 0.98
Prothrombin Time: 12.8 seconds (ref 11.4–15.2)

## 2017-06-17 LAB — MAGNESIUM: Magnesium: 1.6 mg/dL — ABNORMAL LOW (ref 1.7–2.4)

## 2017-06-17 LAB — PHOSPHORUS: Phosphorus: 2.3 mg/dL — ABNORMAL LOW (ref 2.5–4.6)

## 2017-06-17 MED ORDER — MAGNESIUM SULFATE 2 GM/50ML IV SOLN
2.0000 g | Freq: Once | INTRAVENOUS | Status: AC
  Administered 2017-06-17: 2 g via INTRAVENOUS
  Filled 2017-06-17: qty 50

## 2017-06-17 MED ORDER — POTASSIUM PHOSPHATES 15 MMOLE/5ML IV SOLN
20.0000 mmol | Freq: Once | INTRAVENOUS | Status: AC
  Administered 2017-06-17: 20 mmol via INTRAVENOUS
  Filled 2017-06-17: qty 6.67

## 2017-06-17 MED ORDER — SODIUM CHLORIDE 0.9 % IV SOLN
INTRAVENOUS | Status: AC
  Administered 2017-06-17: 11:00:00 via INTRAVENOUS

## 2017-06-17 NOTE — Progress Notes (Signed)
PROGRESS NOTE    ITZIA CUNLIFFE  YQM:[id number] DOB: Jun 14, 1932 DOA: 06/11/2017 PCP: Antony Contras, MD   Brief Narrative: [age] y.o.female,w h/o breast cancer (DCIS), Rheumatoid arthritis, Osteopenia, GERD who presents with hypercalcemia. She was sent to the emergency room via PCP for hypercalcemia and was found to be having some altered mental status as well as encephalopathy.   Pt was admitted for hypercalcemia and ARF and found to have Abdominal Masses and ? pancreatic lesion with likely metastasis.  Work-up was underway for suspected pancreatic malignancy and Oncology is following.   Palliative care met with the patient and family and will continue conversations after work-up is complete.  MRI brain and MRCP Done. MRI Brain showed atrophy and small vessel disease and no acute intracranial findings. MRCP showed Bulky upper abdominal and retroperitoneal lymphadenopathy, suspicious for lymphoma with metastatic disease considered less likely. Case was discussed with Gastroenterology who feel there is no Endoscopic Value and recommended IR Biopsy.  IR consulted for LN Biopsy for Tissue Diagnosis and likely to be done today. Heparin injections will be held until the procedure is completed.      Assessment & Plan:   Principal Problem:   Hypercalcemia Active Problems:   Anemia   ARF (acute renal failure) (HCC)   Pancreatic mass   Palliative care encounter  Hypercalcemia likely of Malignancy -U/S abd showing pancreatic mass/ LAN, likely this is malignancy related,  -CT of the Chest/Abd/Pelvis with Contrast recommended; Will need to wait for Creatinine to improve as I discussed the case with Dr. Lebron Conners who feels that doing this study without contrast has little to no utility.  -Seen by Oncology Dr. Lebron Conners and appreciate further evaluation recommendations  - Vit D 25 and 1,25 were 32.2 and 190.0 respectively, ESR was 21, TSH was 2.821, myeloma, PTH was 12, PTH RP pending  -Ca2+  trended down from greater than 15 -> 9.2 today -IVF Rehydration Discontinued  -S/p Zolendronic Acid 24m iv x1 and likely probably contributing to the kidney dysfunction -Calcitonin therapy completed -Repeat CMP in AM   Abdominal Masses/ LAN suspected Lymphoma rule out other Etiologies  -U/S abdomen showed:  "Retroperitoneal mass lesions in the splenic hilar region,adjacent to the pancreas, and possibly within the tail of the pancreas. These lesions likely represent enlarged lymph nodes or possibly primary tumors. Differential diagnosis would include metastasis, pancreatic carcinoma, and/or lymphoma. Suggest CT abdomen and pelvis for further evaluation" -Seen by Oncology Dr. PLebron Connersand appreciate further evaluation recommendations  -CT of the Chest/Abd/Pelvis with Contrast recommended; Will need to wait for Creatinine to improve as I discussed the case with Dr. PLebron Connerswho feels that doing this study without contrast has little to no utility.  -Dr. SJonnie Finnerdiscussed with ESadie HaberGI Dr. MWatt Climeswho said would need to see CT Abdomen or MRI of the Pancrease first before considering any diagnostic procedures such as EUS or IR Biopsy -Unable to do mammogram as inpatient -Bone Scan showed Multifocal areas of increased activity noted over the cervical, thoracic, and lumbar spine particularly over the lower left lumbar spine. Questionable areas of increased activity noted over anterior ribs bilaterally. These areas could represent metastatic disease -Because Cr still elevated obtained MRCP w/o Contrast to evaluate the Abdomen; MRCP showed Bulky upper abdominal and retroperitoneal lymphadenopathy, suspicious for lymphoma with metastatic disease considered less likely. No evidence of hydronephrosis. -MRI of the brain without contrast showed atrophy and small vessel disease.  No acute intracranial findings.  There is progression of atrophy since 2015. -  IR To evaluate for LN biopsy and CT-guided biopsy procedure of  retroperitoneal or pelvic lymph nodes scheduled tentatively for this afternoon -Palliative care consulted and they will continue conversations with family after work-up is complete.  Palliative care feels they  will recommend hospice involvement in the event the patient is felt to be a poor candidate for treatment. -Dr. Lebron Conners to follow-up later this afternoon  Acute Renal Failure, improving -BUN/creatinine now trending down and went to 26/1.38 -Possibly worsened in the setting of Zometa and recent IV Lasix dosage with 80 mg every 8 hours x2 doses -IV fluid hydration has now stopped given the patient appeared volume overloaded 06/13/17 -C/w Heparin 5000 units subcu q8h and will be held for recurrent procedure. -Avoid Nephrotoxic Medications if possible continue to hold losartan 100 mg p.o. daily -Repeat CMP CMP in the AM.  Abnormal AST -Hep B/ C Negative -Normal liver on [place] -Patient's AST remains slightly elevated and is 73 -Continue to monitor and repeat CMP in a.m.  Hypertension -Continue to Hold Losartan due to ARF -Amlodipine 5 mg daily added and BP better controlled  -Blood pressure is adequately controlled today was 119/66 -C/w Hydralazine 10 mg IV q6h prn sbp >160  Normocytic Anemia -Hb/Hct now 9.6/29.2 -Checked Anemia Panel this AM showed iron level of 34, UIBC of 263, TIBC of 297, saturation ratios of 11%, ferritin level of 150, folate level of 13.8, and a vitamin B12 of 793 -Continue to monitor for signs and symptoms of bleeding -Repeat CBC in the AM  Rheumatoid Arthritis -Continue with Oxycodone-acetaminophen 5-325 mg/tab. 1 tablet every 4 hours as needed for severe pain  Depression and Anxiety  -Continue with Wellbutrin 300 mg 24-hour tablet p.o. Daily -Home alprazolam 1 mg p.o. daily RN held  Pyuria -Abx with Keflex Discontinued,  -Urine Cx showed < 10K col  Acute Metabolic Encephalopathy, improved  -Likely Secondary due to hypercalcemia and volume  depletion and now significantly improved -MRI of the brain without contrast showed atrophy and small vessel disease.  No acute intracranial findings.  There is progression of atrophy since 2015. -Continue to monitor very closely  Hypokalemia -Patient's potassium level exam was 4.3 -Was replete with p.o. potassium chloride 40 mEq yesterday -Continue to monitor and replete as necessary -Repeat-CMP in the a.m.  Hypophosphatemia -Patient's phosphorus level this morning was 2.3 -Replete with IV K-Phos 20 mmol -Continue to monitor and replete as necessary -Repeat phosphorus level in the morning  Hypomagnesemia -Patient magnesium level this morning was 1.6 -Replete with IV mag sulfate 2 g  -Continue to monitor and replete as necessary -Repeat mag level in the a.m.  Hyponatremia -Patient is sodium level is trending down and is now 128 -Started patient on normal saline at 75 mils per hour for 10 hours -Continue to monitor and repeat CMP in AM.  History of Breast Cancer -Malignancy Workup underway for ?pancreatic lesion -Will need outpatient mammogram actually  DVT prophylaxis: Heparin 5000 units subcu every 8 hours Code Status: FULL CODE Family Communication: Discussed with husband at bedside Disposition Plan: Remain inpatient at this time  Consultants:   Medical Oncology  Case was Discussed with Gastroenterology  Interventional Radiology   Procedures: MRI's as above  Antimicrobials:  Anti-infectives (From admission, onward)   Start     Dose/Rate Route Frequency Ordered Stop   06/12/17 2200  cephALEXin (KEFLEX) capsule 250 mg  Status:  Discontinued     250 mg Oral 2 times daily 06/12/17 1415 06/14/17 1004   06/12/17 1000  cephALEXin (KEFLEX) capsule 500 mg  Status:  Discontinued     500 mg Oral 2 times daily 06/12/17 0751 06/12/17 1415     Subjective: Seen and examined at bedside was doing okay.  Had no complaints at this time.  No nausea, vomiting, shortness of  breath, wheezing, or chest pain.  Abdominal pain has improved.  Discussed with her about getting a biopsy and she is agreeable and will be hopefully done later.  Objective: Vitals:   06/16/17 1441 06/16/17 2214 06/17/17 0500 06/17/17 0510  BP: 119/66 (!) 117/58  (!) 138/52  Pulse: 89 88  88  Resp: 16 18  20   Temp: 99 F (37.2 C) 99.3 F (37.4 C)  99 F (37.2 C)  TempSrc: Oral Oral  Oral  SpO2: 94% 94%  94%  Weight:   56.3 kg (124 lb 1.9 oz)   Height:        Intake/Output Summary (Last 24 hours) at 06/17/2017 1206 Last data filed at 06/16/2017 2216 Gross per 24 hour  Intake 100 ml  Output 150 ml  Net -50 ml   Filed Weights   06/15/17 0500 06/16/17 0500 06/17/17 0500  Weight: 55.9 kg (123 lb 3.8 oz) 56.2 kg (123 lb 14.4 oz) 56.3 kg (124 lb 1.9 oz)   Examination: Physical Exam:  Constitutional: Thin elderly Caucasian female is currently in no acute distress.  Appears calm comfortable and is in no acute distress Eyes: Sclera anicteric.  Lids and conjunctive are noninjected ENMT: External ears and nose appear normal.  Grossly normal hearing Neck: Supple with no JVD Respiratory: Diminished to auscultation bilaterally with unlabored breathing.  No appreciable wheezing, rales, rhonchi. Cardiovascular: Rate and rhythm.  Patient has a 2 out of 6 systolic murmur.  No lower extremity edema noted Abdomen: Soft, nontender to palpate in the mid abdomen .  Nondistended.  Bowel sounds present all 4 quadrants GU: Deferred Musculoskeletal: No contractures or cyanosis.  No joint deformities noted. Skin: Skin is warm and dry with no appreciable rashes or lesions on limited skin evaluation  Neurologic: Cranial nerves II through XII grossly intact with no appreciable focal deficits Psychiatric: Normal mood and affect.  Intact judgment and insight.  Patient is awake, alert, and oriented x3.  Data Reviewed: I have personally reviewed following labs and imaging studies  CBC: Recent Labs  Lab  06/11/17 2249  06/13/17 0439 06/14/17 0523 06/15/17 0456 06/16/17 0555 06/17/17 0546  WBC 4.9   < > 5.6 9.3 7.8 5.8 6.5  NEUTROABS 3.7  --   --   --  6.4 4.4 5.1  HGB 10.9*   < > 9.0* 9.7* 8.8* 9.0* 9.6*  HCT 33.1*   < > 26.7* 29.8* 26.8* 26.8* 29.2*  MCV 88.3   < > 87.8 87.6 87.0 86.5 86.4  PLT 324   < > 299 293 295 275 306   < > = values in this interval not displayed.   Basic Metabolic Panel: Recent Labs  Lab 06/13/17 0439 06/14/17 0523 06/15/17 0456 06/16/17 0555 06/17/17 0546  NA 134* 135 132* 130* 128*  K 3.3* 2.9* 3.6 3.4* 4.3  CL 103 98* 96* 96* 96*  CO2 21* 23 23 23  21*  GLUCOSE 96 100* 88 77 90  BUN 19 22* 24* 24* 26*  CREATININE 1.52* 1.78* 1.55* 1.35* 1.38*  CALCIUM 11.6* 11.2* 9.9 9.1 9.2  MG 1.2* 1.1* 1.6* 1.8 1.6*  PHOS 2.7 2.8  2.9 1.4* 2.2* 2.3*   GFR: Estimated Creatinine Clearance:  26 mL/min (A) (by C-G formula based on SCr of 1.38 mg/dL (H)). Liver Function Tests: Recent Labs  Lab 06/11/17 2249 06/12/17 1028 06/13/17 0439 06/14/17 0523 06/15/17 0456 06/16/17 0555 06/17/17 0546  AST 74* 65*  --   --  75* 74* 73*  ALT 28 23  --   --  20 21 21   ALKPHOS 93 77  --   --  71 60 72  BILITOT 0.5 0.5  --   --  0.5 0.6 0.5  PROT 7.6 6.4*  --   --  5.8* 5.8* 5.7*  ALBUMIN 4.1 3.4*  3.6 3.2* 3.6 3.3* 3.1* 3.1*   No results for input(s): LIPASE, AMYLASE in the last 168 hours. No results for input(s): AMMONIA in the last 168 hours. Coagulation Profile: Recent Labs  Lab 06/17/17 1101  INR 0.98   Cardiac Enzymes: No results for input(s): CKTOTAL, CKMB, CKMBINDEX, TROPONINI in the last 168 hours. BNP (last 3 results) No results for input(s): PROBNP in the last 8760 hours. HbA1C: No results for input(s): HGBA1C in the last 72 hours. CBG: No results for input(s): GLUCAP in the last 168 hours. Lipid Profile: No results for input(s): CHOL, HDL, LDLCALC, TRIG, CHOLHDL, LDLDIRECT in the last 72 hours. Thyroid Function Tests: No results for  input(s): TSH, T4TOTAL, FREET4, T3FREE, THYROIDAB in the last 72 hours. Anemia Panel: Recent Labs    06/16/17 0555  VITAMINB12 793  FOLATE 13.8  FERRITIN 150  TIBC 297  IRON 34  RETICCTPCT 1.3   Sepsis Labs: No results for input(s): PROCALCITON, LATICACIDVEN in the last 168 hours.  Recent Results (from the past 240 hour(s))  Urine culture     Status: Abnormal   Collection Time: 06/12/17 12:00 AM  Result Value Ref Range Status   Specimen Description   Final    URINE, CLEAN CATCH Performed at [facility], [place] [address] [place], [place] [address]    Special Requests   Final    NONE Performed at [facility], [place] [address], [place], [place] [address]    Culture (A)  Final    <10,000 COLONIES/mL INSIGNIFICANT GROWTH Performed at [facility] [address] [place], [place] [address]    Report Status 06/13/2017 FINAL  Final    Radiology Studies: Mr Brain Wo Contrast  Result Date: 06/15/2017 CLINICAL DATA:  Encephalopathy, and altered level of consciousness. History of breast cancer. EXAM: MRI HEAD WITHOUT CONTRAST TECHNIQUE: Multiplanar, multiecho pulse sequences of the brain and surrounding structures were obtained without intravenous contrast. COMPARISON:  CT head 02/28/2013. FINDINGS: Brain: No evidence for acute infarction, hemorrhage, mass lesion, hydrocephalus, or extra-axial fluid. Advanced atrophy. Mild to moderate subcortical and periventricular T2 and FLAIR hyperintensities, likely chronic microvascular ischemic change. Vascular: Flow voids are maintained throughout the carotid, basilar, and vertebral arteries. There are no areas of chronic hemorrhage. Skull and upper cervical spine: Normal marrow signal. Cervical spondylosis. Sinuses/Orbits: Negative. Other: None. Compared with prior CT, there is progression of atrophy. IMPRESSION: Atrophy and small vessel disease.  No acute intracranial findings. Progression of atrophy since  2015. Electronically Signed   By: Staci Righter M.D.   On: 06/15/2017 14:24   Mr Abdomen Mrcp Wo Contrast  Result Date: 06/16/2017 CLINICAL DATA:  Retroperitoneal mass on recent ultrasound. Renal insufficiency. Personal history of left breast carcinoma. EXAM: MRI ABDOMEN WITHOUT CONTRAST  (INCLUDING MRCP) TECHNIQUE: Multiplanar multisequence MR imaging of the abdomen was performed. Heavily T2-weighted images of the biliary and pancreatic  ducts were obtained, and three-dimensional MRCP images were rendered by post processing. COMPARISON:  Ultrasound on 06/12/2017 FINDINGS: Image degradation by respiratory motion artifact noted. Lower chest: Small bilateral pleural effusions bibasilar atelectasis. Hepatobiliary: No masses visualized on this unenhanced exam. Prior cholecystectomy. Mild biliary ductal dilatation is noted, however no evidence of choledocholithiasis or other obstructing etiology. This likely related to prior cholecystectomy. Pancreas: No definite pancreatic mass on this unenhanced exam. No evidence of pancreatic ductal dilatation or pancreas divisum. Spleen:  Within normal limits in size. Adrenals/Urinary tract: Unremarkable. No evidence of hydronephrosis. Stomach/Bowel: Visualized portion unremarkable. Vascular/Lymphatic: Bulky lymphadenopathy is seen in the upper abdomen within the gastrohepatic and gastrosplenic ligaments, left cardiophrenic angle, and porta hepatis. Bulky lymphadenopathy is also seen throughout the abdominal retroperitoneum. Index area in the retrocaval space measures 3.3 cm in short axis on image 31/10. Other: Minimal perihepatic ascites and mild diffuse mesenteric and abdominal wall edema. Musculoskeletal:  No suspicious bone lesions identified. IMPRESSION: Bulky upper abdominal and retroperitoneal lymphadenopathy, suspicious for lymphoma with metastatic disease considered less likely. No evidence of hydronephrosis. Electronically Signed   By: Earle Gell M.D.   On: 06/16/2017  08:15   Scheduled Meds: . amLODipine  5 mg Oral Daily  . buPROPion  300 mg Oral Daily  . sucralfate  1 g Oral QID   Continuous Infusions: . sodium chloride 75 mL/hr at 06/17/17 1057  . potassium PHOSPHATE IVPB (mmol)      LOS: 5 days   Kerney Elbe, DO [facility] Pager [phone]  If 7PM-7AM, please contact night-coverage [email or web address] Password [facility] 06/17/2017, 12:06 PM

## 2017-06-17 NOTE — Care Management Important Message (Signed)
Important Message  Patient Details  Name: Monique Crosby MRN: [id number] Date of Birth: 06/03/1932   Medicare Important Message Given:  Yes    Kerin Salen 06/17/2017, 11:16 AMImportant Message  Patient Details  Name: Monique Crosby MRN: [id number] Date of Birth: 04-18-1932   Medicare Important Message Given:  Yes    Kerin Salen 06/17/2017, 11:16 AM

## 2017-06-17 NOTE — Care Management Note (Signed)
Case Management Note  Patient Details  Name: Monique Crosby MRN: [id number] Date of Birth: 03-27-1932  Subjective/Objective:84 y/o f admitted whypercalcemia. From home. Hx: Breast Ca. DNR. Concerns for lymphoma.IR- lymph node bx today.GI following. Palliative-continue conversations after work up complete.                     Action/Plan:d/c plan home.   Expected Discharge Date:  (unknown)               Expected Discharge Plan:  Home/Self Care  In-House Referral:     Discharge planning Services  CM Consult  Post Acute Care Choice:    Choice offered to:     DME Arranged:    DME Agency:     HH Arranged:    HH Agency:     Status of Service:  In process, will continue to follow  If discussed at Long Length of Stay Meetings, dates discussed:    Additional Comments:  Dessa Phi, RN 06/17/2017, 11:42 AM

## 2017-06-17 NOTE — Consult Note (Signed)
Chief Complaint: Patient was seen in consultation today for CT-guided biopsy of abdominal/pelvic lymph node Chief Complaint  Patient presents with  . abnormal labs    Referring Physician(s): Sheikh,O/Perlov,M  Supervising Physician: Jacqulynn Cadet  Patient Status: [facility] - In-pt  History of Present Illness: Monique Crosby is a 84 y.o. 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  Subsequent imaging has now revealed bulky upper abdominal retroperitoneal lymphadenopathy suspicious for lymphoma.  Request now received for image guided lymph node biopsy for further evaluation.  Past Medical History:  Diagnosis Date  . ARTHRITIS, RHEUMATOID 11/29/2006  . Breast cancer ([place])    left  . BURSITIS, SHOULDER 08/09/2008  . DCIS (ductal carcinoma in situ)   . DEPRESSION 02/24/2007  . DIVERTICULOSIS, COLON 02/24/2007  . External hemorrhoids   . GERD 02/24/2007  . Hypercalcemia   . OSTEOPENIA 11/29/2006  . Personal history of radiation therapy 02/1998    Past Surgical History:  Procedure Laterality Date  . ABDOMINAL HYSTERECTOMY    . APPENDECTOMY    . BREAST BIOPSY Left 01/03/1998   malignant  . BREAST LUMPECTOMY Left 01/1998  . CATARACT EXTRACTION     righ and left  . CHOLECYSTECTOMY    . COLONOSCOPY  11/13/2002   diverticulosis, external hemorrhoids  . DILATION AND CURETTAGE OF UTERUS    . Fatty Tumor Excision     rigth forearm  . RECTOCELE REPAIR     and cystocele  . ROTATOR CUFF REPAIR  2010   left, Dr. Lorin Mercy  . UPPER GASTROINTESTINAL ENDOSCOPY  01/25/2011   Normal    Allergies: Amoxicillin and Reglan [metoclopramide]  Medications: Prior to Admission medications   Medication Sig Start Date End Date Taking? Authorizing Provider  buPROPion (WELLBUTRIN XL) 300 MG 24 hr tablet Take 1 tablet (300 mg  total) by mouth daily. 07/04/12  Yes Marletta Lor, MD  cephALEXin (KEFLEX) 500 MG capsule Take 500 mg by mouth 2 (two) times daily.  06/11/17  Yes [provider]  losartan (COZAAR) 100 MG tablet Take 100 mg by mouth daily.  06/03/17  Yes [provider]  sucralfate (CARAFATE) 1 G tablet TAKE 1 TABLET BY MOUTH 4 TIMES A DAY 01/17/13  Yes Marletta Lor, MD  ALPRAZolam Duanne Moron) 1 MG tablet TAKE 1 TABLET EVERY DAY as needed Patient not taking: Reported on 06/11/2017 07/04/12   Marletta Lor, MD  oxyCODONE-acetaminophen (PERCOCET/ROXICET) 5-325 MG per tablet Take 1 tablet by mouth every 4 (four) hours as needed. Patient not taking: Reported on 06/11/2017 02/28/13   Larene Pickett, PA-C     Family History  Problem Relation Age of Onset  . Lung cancer Mother   . Coronary artery disease Mother   . Prostate cancer Father   . Liver cancer Father   . Kidney failure Brother   . Coronary artery disease Brother   . Thyroid disease Sister     Social History   Socioeconomic History  . Marital status: Married    Spouse name: Not on file  . Number of children: 1  . Years of education: Not on file  . Highest education level: Not on file  Occupational History  . Occupation: retired  Scientific laboratory technician  . Financial resource strain: Not on file  . Food insecurity:    Worry: Not on file    Inability:  Not on file  . Transportation needs:    Medical: Not on file    Non-medical: Not on file  Tobacco Use  . Smoking status: Former Smoker    Last attempt to quit: 02/19/1978    Years since quitting: 39.3  . Smokeless tobacco: Never Used  Substance and Sexual Activity  . Alcohol use: No  . Drug use: No  . Sexual activity: Not on file  Lifestyle  . Physical activity:    Days per week: Not on file    Minutes per session: Not on file  . Stress: Not on file  Relationships  . Social connections:    Talks on phone: Not on file    Gets together: Not on file    Attends  religious service: Not on file    Active member of club or organization: Not on file    Attends meetings of clubs or organizations: Not on file    Relationship status: Not on file  Other Topics Concern  . Not on file  Social History Narrative   1 cup of coffee daily. Stopped 4 months ago      Review of Systems currently denies fever, headache, chest pain, dyspnea, cough, abdominal pain, nausea, vomiting or bleeding.  She does have intermittent back pain  Vital Signs: BP (!) 138/52 (BP Location: Left Arm)   Pulse 88   Temp 99 F (37.2 C) (Oral)   Resp 20   Ht 5\' 3"  (1.6 m)   Wt 124 lb 1.9 oz (56.3 kg)   SpO2 94%   BMI 21.99 kg/m   Physical Exam awake, alert.  Chest with diminished breath sounds bases; heart with regular rate and rhythm, positive murmur.  Abdomen soft, positive bowel sounds, mild mid abd tenderness; no lower extremity edema.  Imaging: Dg Chest 2 View  Result Date: 06/12/2017 CLINICAL DATA:  Hyperglycemia, confusion, high blood pressure. EXAM: CHEST - 2 VIEW COMPARISON:  01/31/2011 FINDINGS: Hyperinflation suggesting emphysema. Mild cardiac enlargement. No vascular congestion, edema, or consolidation. Linear scarring and calcified granulomas in the upper lungs likely representing postinflammatory change no blunting of costophrenic angles no pneumothorax. Mediastinal contours appear intact. Degenerative changes in the spine and shoulders. Postoperative changes in both shoulders. Surgical clips in the left breast. Aortic calcification. IMPRESSION: Emphysematous changes in the lungs. Chronic postinflammatory changes in the apices. No evidence of active pulmonary disease. Aortic atherosclerosis. Electronically Signed   By: Lucienne Capers M.D.   On: 06/12/2017 01:59   Mr Brain Wo Contrast  Result Date: 06/15/2017 CLINICAL DATA:  Encephalopathy, and altered level of consciousness. History of breast cancer. EXAM: MRI HEAD WITHOUT CONTRAST TECHNIQUE: Multiplanar, multiecho  pulse sequences of the brain and surrounding structures were obtained without intravenous contrast. COMPARISON:  CT head 02/28/2013. FINDINGS: Brain: No evidence for acute infarction, hemorrhage, mass lesion, hydrocephalus, or extra-axial fluid. Advanced atrophy. Mild to moderate subcortical and periventricular T2 and FLAIR hyperintensities, likely chronic microvascular ischemic change. Vascular: Flow voids are maintained throughout the carotid, basilar, and vertebral arteries. There are no areas of chronic hemorrhage. Skull and upper cervical spine: Normal marrow signal. Cervical spondylosis. Sinuses/Orbits: Negative. Other: None. Compared with prior CT, there is progression of atrophy. IMPRESSION: Atrophy and small vessel disease.  No acute intracranial findings. Progression of atrophy since 2015. Electronically Signed   By: Staci Righter M.D.   On: 06/15/2017 14:24   Nm Bone Scan Whole Body  Result Date: 06/13/2017 CLINICAL DATA:  Breast cancer. EXAM: NUCLEAR MEDICINE WHOLE BODY BONE SCAN  TECHNIQUE: Whole body anterior and posterior images were obtained approximately 3 hours after intravenous injection of radiopharmaceutical. RADIOPHARMACEUTICALS:  21.4 mCi Technetium-67m MDP IV COMPARISON:  Chest x-ray 06/13/2017.  CT 02/01/2011. FINDINGS: Bilateral renal function and excretion. Multifocal mild areas of increased activity noted about the cervical, thoracic, and lumbar spine. Questionable mild areas of increased activity noted and the anterior ribs bilaterally. More intense area of increased activity noted over the left lower lumbar spine. IMPRESSION: Multifocal areas of increased activity noted over the cervical, thoracic, and lumbar spine particularly over the lower left lumbar spine. Questionable areas of increased activity noted over anterior ribs bilaterally. These areas could represent metastatic disease. Electronically Signed   By: Marcello Moores  Register   On: 06/13/2017 15:10   US Abdomen  Complete  Result Date: 06/12/2017 CLINICAL DATA:  Acute renal failure. Abnormal liver function. History of breast cancer. EXAM: ABDOMEN ULTRASOUND COMPLETE COMPARISON:  None. FINDINGS: Gallbladder: The gallbladder is surgically absent. Common bile duct: Diameter: 6 mm, normal Liver: No focal lesion identified. Within normal limits in parenchymal echogenicity. Portal vein is patent on color Doppler imaging with normal direction of blood flow towards the liver. Minimal fluid around the age of the liver. Small right pleural effusion. IVC: No abnormality visualized. Pancreas: There appears to be a mass lesion in or adjacent to the tail of the pancreas measuring about 2 cm maximal diameter. Additional nodules in the retroperitoneum and around the pancreas likely represent enlarged lymph nodes. Heterogeneous mass in the region of the splenic hilum measures up to 6.7 cm diameter. No pancreatic ductal dilatation or peripancreatic fluid. Spleen: Size and appearance within normal limits. Right Kidney: Length: 9.8 cm. Increased parenchymal echotexture consistent with chronic medical renal disease. No mass or hydronephrosis visualized. Left Kidney: Length: 10 cm. Increased parenchymal echotexture consistent with chronic medical renal disease. No mass or hydronephrosis visualized. Abdominal aorta: No aneurysm visualized. Other findings: None. IMPRESSION: 1. Retroperitoneal mass lesions in the splenic hilar region, adjacent to the pancreas, and possibly within the tail of the pancreas. These lesions likely represent enlarged lymph nodes or possibly primary tumors. Differential diagnosis would include metastasis, pancreatic carcinoma, and/or lymphoma. Suggest CT abdomen and pelvis for further evaluation. 2. Increased renal parenchymal echotexture bilaterally consistent with chronic medical renal disease. No hydronephrosis. 3. Small right pleural effusion. Small amount of upper abdominal ascites. Electronically Signed   By:  Lucienne Capers M.D.   On: 06/12/2017 06:51   Mr Abdomen Mrcp Wo Contrast  Result Date: 06/16/2017 CLINICAL DATA:  Retroperitoneal mass on recent ultrasound. Renal insufficiency. Personal history of left breast carcinoma. EXAM: MRI ABDOMEN WITHOUT CONTRAST  (INCLUDING MRCP) TECHNIQUE: Multiplanar multisequence MR imaging of the abdomen was performed. Heavily T2-weighted images of the biliary and pancreatic ducts were obtained, and three-dimensional MRCP images were rendered by post processing. COMPARISON:  Ultrasound on 06/12/2017 FINDINGS: Image degradation by respiratory motion artifact noted. Lower chest: Small bilateral pleural effusions bibasilar atelectasis. Hepatobiliary: No masses visualized on this unenhanced exam. Prior cholecystectomy. Mild biliary ductal dilatation is noted, however no evidence of choledocholithiasis or other obstructing etiology. This likely related to prior cholecystectomy. Pancreas: No definite pancreatic mass on this unenhanced exam. No evidence of pancreatic ductal dilatation or pancreas divisum. Spleen:  Within normal limits in size. Adrenals/Urinary tract: Unremarkable. No evidence of hydronephrosis. Stomach/Bowel: Visualized portion unremarkable. Vascular/Lymphatic: Bulky lymphadenopathy is seen in the upper abdomen within the gastrohepatic and gastrosplenic ligaments, left cardiophrenic angle, and porta hepatis. Bulky lymphadenopathy is also seen throughout the abdominal retroperitoneum.  Index area in the retrocaval space measures 3.3 cm in short axis on image 31/10. Other: Minimal perihepatic ascites and mild diffuse mesenteric and abdominal wall edema. Musculoskeletal:  No suspicious bone lesions identified. IMPRESSION: Bulky upper abdominal and retroperitoneal lymphadenopathy, suspicious for lymphoma with metastatic disease considered less likely. No evidence of hydronephrosis. Electronically Signed   By: Earle Gell M.D.   On: 06/16/2017 08:15   Dg Chest Port 1  View  Result Date: 06/13/2017 CLINICAL DATA:  Acute respiratory failure. EXAM: PORTABLE CHEST 1 VIEW COMPARISON:  Chest x-ray 06/12/2017. FINDINGS: Mediastinum and hilar structures normal. Cardiomegaly. New onset diffuse bilateral pulmonary infiltrates/edema bilateral pleural effusions. No pneumothorax. IMPRESSION: Cardiomegaly. New onset of diffuse bilateral pulmonary infiltrates/edema. Small bilateral pleural effusions. Findings most consistent with CHF. Electronically Signed   By: Marcello Moores  Register   On: 06/13/2017 14:48    Labs:  CBC: Recent Labs    06/14/17 0523 06/15/17 0456 06/16/17 0555 06/17/17 0546  WBC 9.3 7.8 5.8 6.5  HGB 9.7* 8.8* 9.0* 9.6*  HCT 29.8* 26.8* 26.8* 29.2*  PLT 293 295 275 306    COAGS: No results for input(s): INR, APTT in the last 8760 hours.  BMP: Recent Labs    06/14/17 0523 06/15/17 0456 06/16/17 0555 06/17/17 0546  NA 135 132* 130* 128*  K 2.9* 3.6 3.4* 4.3  CL 98* 96* 96* 96*  CO2 23 23 23  21*  GLUCOSE 100* 88 77 90  BUN 22* 24* 24* 26*  CALCIUM 11.2* 9.9 9.1 9.2  CREATININE 1.78* 1.55* 1.35* 1.38*  GFRNONAA 25* 30* 35* 35*  GFRAA 29* 35* 41* 40*    LIVER FUNCTION TESTS: Recent Labs    06/12/17 1028  06/14/17 0523 06/15/17 0456 06/16/17 0555 06/17/17 0546  BILITOT 0.5  --   --  0.5 0.6 0.5  AST 65*  --   --  75* 74* 73*  ALT 23  --   --  20 21 21   ALKPHOS 77  --   --  71 60 72  PROT 6.4*  --   --  5.8* 5.8* 5.7*  ALBUMIN 3.4*  3.6   < > 3.6 3.3* 3.1* 3.1*   < > = values in this interval not displayed.    TUMOR MARKERS: No results for input(s): AFPTM, CEA, CA199, CHROMGRNA in the last 8760 hours.  Assessment and Plan: 84 y.o. 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  Subsequent imaging has now revealed bulky upper abdominal retroperitoneal  lymphadenopathy suspicious for lymphoma.  Request now received for image guided lymph node biopsy for further evaluation.  Imaging studies have been reviewed by Dr. Laurence Ferrari.Risks and benefits discussed with the patient/family including, but not limited to bleeding, infection, damage to adjacent structures or low yield requiring additional tests.  All of the patient's questions were answered, patient is agreeable to proceed. Consent signed and in chart.  Procedure tentatively scheduled for later this afternoon.  Heparin injections will need to be held until after above procedure.    Thank you for this interesting consult.  I greatly enjoyed meeting Monique Crosby and look forward to participating in their care.  A copy of this report was sent to the requesting provider on this date.  Electronically Signed: D. Rowe Robert, PA-C 06/17/2017, 10:48 AM   I spent a total of 30 minutes  in face to face in clinical consultation, greater than 50% of which was counseling/coordinating care for CT-guided biopsy of abdominal/pelvic lymph node

## 2017-06-18 ENCOUNTER — Inpatient Hospital Stay (HOSPITAL_COMMUNITY): Payer: Medicare Other

## 2017-06-18 LAB — CBC WITH DIFFERENTIAL/PLATELET
Basophils Absolute: 0 10*3/uL (ref 0.0–0.1)
Basophils Relative: 0 %
EOS PCT: 2 %
Eosinophils Absolute: 0.1 10*3/uL (ref 0.0–0.7)
HCT: 28 % — ABNORMAL LOW (ref 36.0–46.0)
Hemoglobin: 9.3 g/dL — ABNORMAL LOW (ref 12.0–15.0)
LYMPHS ABS: 0.6 10*3/uL — AB (ref 0.7–4.0)
LYMPHS PCT: 8 %
MCH: 28.9 pg (ref 26.0–34.0)
MCHC: 33.2 g/dL (ref 30.0–36.0)
MCV: 87 fL (ref 78.0–100.0)
MONO ABS: 0.5 10*3/uL (ref 0.1–1.0)
Monocytes Relative: 6 %
Neutro Abs: 6.2 10*3/uL (ref 1.7–7.7)
Neutrophils Relative %: 84 %
PLATELETS: 298 10*3/uL (ref 150–400)
RBC: 3.22 MIL/uL — ABNORMAL LOW (ref 3.87–5.11)
RDW: 13.7 % (ref 11.5–15.5)
WBC: 7.4 10*3/uL (ref 4.0–10.5)

## 2017-06-18 LAB — COMPREHENSIVE METABOLIC PANEL
ALT: 23 U/L (ref 14–54)
ANION GAP: 11 (ref 5–15)
AST: 71 U/L — ABNORMAL HIGH (ref 15–41)
Albumin: 2.8 g/dL — ABNORMAL LOW (ref 3.5–5.0)
Alkaline Phosphatase: 68 U/L (ref 38–126)
BUN: 23 mg/dL — ABNORMAL HIGH (ref 6–20)
CHLORIDE: 101 mmol/L (ref 101–111)
CO2: 20 mmol/L — ABNORMAL LOW (ref 22–32)
Calcium: 8.9 mg/dL (ref 8.9–10.3)
Creatinine, Ser: 1.29 mg/dL — ABNORMAL HIGH (ref 0.44–1.00)
GFR, EST AFRICAN AMERICAN: 43 mL/min — AB (ref >60)
GFR, EST NON AFRICAN AMERICAN: 37 mL/min — AB (ref >60)
Glucose, Bld: 91 mg/dL (ref 65–99)
POTASSIUM: 4.2 mmol/L (ref 3.5–5.1)
Sodium: 132 mmol/L — ABNORMAL LOW (ref 135–145)
Total Bilirubin: 0.5 mg/dL (ref 0.3–1.2)
Total Protein: 5.7 g/dL — ABNORMAL LOW (ref 6.5–8.1)

## 2017-06-18 LAB — PTH-RELATED PEPTIDE: PTH-related peptide: <2 pmol/L

## 2017-06-18 LAB — MAGNESIUM: MAGNESIUM: 1.8 mg/dL (ref 1.7–2.4)

## 2017-06-18 LAB — PHOSPHORUS: PHOSPHORUS: 2.5 mg/dL (ref 2.5–4.6)

## 2017-06-18 MED ORDER — MIDAZOLAM HCL 2 MG/2ML IJ SOLN
INTRAMUSCULAR | Status: AC | PRN
  Administered 2017-06-18 (×2): 0.5 mg via INTRAVENOUS
  Administered 2017-06-18: 1 mg via INTRAVENOUS
  Administered 2017-06-18: 0.5 mg via INTRAVENOUS

## 2017-06-18 MED ORDER — FENTANYL CITRATE (PF) 100 MCG/2ML IJ SOLN
INTRAMUSCULAR | Status: AC | PRN
  Administered 2017-06-18: 50 ug via INTRAVENOUS
  Administered 2017-06-18 (×2): 25 ug via INTRAVENOUS
  Administered 2017-06-18: 50 ug via INTRAVENOUS

## 2017-06-18 MED ORDER — MIDAZOLAM HCL 2 MG/2ML IJ SOLN
INTRAMUSCULAR | Status: AC
  Filled 2017-06-18: qty 4

## 2017-06-18 MED ORDER — SODIUM CHLORIDE 0.9 % IV SOLN
INTRAVENOUS | Status: AC
Start: 2017-06-18 — End: 2017-06-18
  Administered 2017-06-18: 09:00:00 via INTRAVENOUS

## 2017-06-18 MED ORDER — LIDOCAINE HCL 1 % IJ SOLN
INTRAMUSCULAR | Status: AC | PRN
  Administered 2017-06-18: 10 mL via INTRADERMAL

## 2017-06-18 MED ORDER — FENTANYL CITRATE (PF) 100 MCG/2ML IJ SOLN
INTRAMUSCULAR | Status: AC
  Filled 2017-06-18: qty 4

## 2017-06-18 NOTE — Procedures (Signed)
CT guided core biopsies of left para-aortic retroperitoneal lymph node.  5 cores obtained and placed in saline.  Minimal blood loss and no immediate complication.

## 2017-06-18 NOTE — Progress Notes (Signed)
PROGRESS NOTE    Monique Crosby  YYQ:[id number] DOB: 1932-04-12 DOA: 06/11/2017 PCP: Antony Contras, MD   Brief Narrative: Monique Crosby y.o.female,w h/o breast cancer (DCIS), Rheumatoid arthritis, Osteopenia, GERD who presents with hypercalcemia. She was sent to the emergency room via PCP for hypercalcemia and was found to be having some altered mental status as well as encephalopathy.   Pt was admitted for hypercalcemia and ARF and found to have Abdominal Masses and ? pancreatic lesion with likely metastasis.  Work-up was underway for suspected pancreatic malignancy and Oncology is following.   Palliative care met with the patient and family and will continue conversations after work-up is complete.  MRI brain and MRCP Done. MRI Brain showed atrophy and small vessel disease and no acute intracranial findings. MRCP showed Bulky upper abdominal and retroperitoneal lymphadenopathy, suspicious for lymphoma with metastatic disease considered less likely. Case was discussed with Gastroenterology who feel there is no Endoscopic Value and recommended IR Biopsy.  IR consulted for LN Biopsy for Tissue Diagnosis and was unfortunately not able to be done yesterday but is scheduled to be done this afternoon around 12:30pm. Heparin injections will be held until the procedure is completed.      Assessment & Plan:   Principal Problem:   Hypercalcemia Active Problems:   Anemia   ARF (acute renal failure) (HCC)   Pancreatic mass   Palliative care encounter  Hypercalcemia likely of Malignancy -U/S abd showing pancreatic mass/ LAN, likely this is malignancy related,  -CT of the Chest/Abd/Pelvis with Contrast recommended; Will need to wait for Creatinine to improve as I discussed the case with Dr. Lebron Conners who feels that doing this study without contrast has little to no utility.  -Seen by Oncology Dr. Lebron Conners and appreciate further evaluation recommendations  - Vit D 25 and 1,25 were 32.2 and 190.0  respectively, ESR was 21, TSH was 2.821, myeloma, PTH was 12, PTH RP pending  -Ca2+ trended down from greater than 15 -> 8.9 today -IVF Rehydration restarted yesterday will be continued today given her AKI -S/p Zolendronic Acid 57m iv x1 and likely probably contributing to the kidney dysfunction -Calcitonin therapy completed -Repeat CMP in AM   Abdominal Masses/ LAN suspected Lymphoma rule out other Etiologies  -U/S abdomen showed:  "Retroperitoneal mass lesions in the splenic hilar region,adjacent to the pancreas, and possibly within the tail of the pancreas. These lesions likely represent enlarged lymph nodes or possibly primary tumors. Differential diagnosis would include metastasis, pancreatic carcinoma, and/or lymphoma. Suggest CT abdomen and pelvis for further evaluation" -Seen by Oncology Dr. PLebron Connersand appreciate further evaluation recommendations  -CT of the Chest/Abd/Pelvis with Contrast recommended; Will need to wait for Creatinine to improve as I discussed the case with Dr. PLebron Connerswho feels that doing this study without contrast has little to no utility.  -Dr. SJonnie Finnerdiscussed with ESadie HaberGI Dr. MWatt Climeswho said would need to see CT Abdomen or MRI of the Pancrease first before considering any diagnostic procedures such as EUS or IR Biopsy -Unable to do mammogram as inpatient -Bone Scan showed Multifocal areas of increased activity noted over the cervical, thoracic, and lumbar spine particularly over the lower left lumbar spine. Questionable areas of increased activity noted over anterior ribs bilaterally. These areas could represent metastatic disease -Because Cr still elevated obtained MRCP w/o Contrast to evaluate the Abdomen; MRCP showed Bulky upper abdominal and retroperitoneal lymphadenopathy, suspicious for lymphoma with metastatic disease considered less likely. No evidence of hydronephrosis. -MRI of the brain without  contrast showed atrophy and small vessel disease.  No acute  intracranial findings.  There is progression of atrophy since 2015. -IR To evaluate for LN biopsy and CT-guided biopsy procedure of retroperitoneal or pelvic lymph nodes will be done this afternoon -Palliative care consulted and they will continue conversations with family after work-up is complete.  Palliative care feels they  will recommend hospice involvement in the event the patient is felt to be a poor candidate for treatment. -Dr. Lebron Conners to follow-up after Biopsy   Acute Renal Failure, improving -BUN/creatinine now trending down and went to 23/1.29 -Possibly worsened in the setting of Zometa and recent IV Lasix dosage with 80 mg every 8 hours x2 doses -IV fluid hydration was stopped given the patient appeared volume overloaded 06/13/17 but re-initiated yesterday at 75 mL/hr for 10 hours and will resume again for 16 hours today -C/w Heparin 5000 units subcu q8h and will be held for recurrent procedure. -Avoid Nephrotoxic Medications if possible continue to hold Losartan 100 mg p.o. daily -Repeat CMP CMP in the AM.  Abnormal AST -Hep B/ C Negative -Normal liver on [place] -Patient's AST remains slightly elevated and is 71 -Continue to monitor and repeat CMP in a.m.  Hypertension -Continue to Hold Losartan due to ARF -Amlodipine 5 mg daily added and BP better controlled  -Blood pressure is adequately controlled today was 119/66 -C/w Hydralazine 10 mg IV q6h prn sbp >160  Normocytic Anemia -Hb/Hct now 9.3/28.0 -Checked Anemia Panel this AM showed iron level of 34, UIBC of 263, TIBC of 297, saturation ratios of 11%, ferritin level of 150, folate level of 13.8, and a vitamin B12 of 793 -Continue to monitor for signs and symptoms of bleeding -Repeat CBC in the AM  Rheumatoid Arthritis -Continue with Oxycodone-Acetaminophen 5-325 mg/tab. 1 tablet every 4 hours as needed for severe pain  Depression and Anxiety  -Continue with Wellbutrin 300 mg 24-hour tablet p.o. Daily -Home  alprazolam 1 mg p.o. daily RN held  Pyuria -Abx with Keflex Discontinued,  -Urine Cx showed < 10K col  Acute Metabolic Encephalopathy, improved  -Likely Secondary due to hypercalcemia and volume depletion and now significantly improved -MRI of the brain without contrast showed atrophy and small vessel disease.  No acute intracranial findings.  There is progression of atrophy since 2015. -Continue to monitor very closely  Hypokalemia -Patient's potassium level exam was 4.2 -Continue to Monitor and replete as necessary -Repeat-CMP in the a.m.  Hypophosphatemia -Patient's phosphorus level this morning was 2.5 -Replete with IV K-Phos 20 mmol yesterday -Continue to monitor and replete as necessary -Repeat phosphorus level in the morning  Hypomagnesemia -Patient magnesium level this morning was 1.8 -Replete with IV mag sulfate 2 g yesterday  -Continue to monitor and replete as necessary -Repeat mag level in the a.m.  Hyponatremia -Patient is Sodium Level was trending down and was 128; Improved to  -Started patient on normal saline at 75 mils per hour for 10 hours yesterday but will resume today for 16 hours and reassess need for IVF in AM  -Continue to monitor and repeat CMP in AM.  History of Breast Cancer -Malignancy Workup underway for ?pancreatic lesion -Will need outpatient mammogram actually  DVT prophylaxis: Heparin 5000 units subcu every 8 hours Code Status: FULL CODE Family Communication: Discussed with husband at bedside Disposition Plan: Remain inpatient at this time  Consultants:   Medical Oncology  Case was Discussed with Gastroenterology  Interventional Radiology   Procedures: MRI's as above  Antimicrobials:  Anti-infectives (  From admission, onward)   Start     Dose/Rate Route Frequency Ordered Stop   06/12/17 2200  cephALEXin (KEFLEX) capsule 250 mg  Status:  Discontinued     250 mg Oral 2 times daily 06/12/17 1415 06/14/17 1004   06/12/17 1000   cephALEXin (KEFLEX) capsule 500 mg  Status:  Discontinued     500 mg Oral 2 times daily 06/12/17 0751 06/12/17 1415     Subjective: Seen and examined at bedside stated abdomen was hurting earlier but was improved with p.o. pain medications.  Denies any chest pain, shortness of breath, nausea or vomiting.  Lightheadedness or dizziness.  Understand that she will be going for a CT-guided biopsy today.  Objective: Vitals:   06/17/17 1330 06/17/17 2105 06/18/17 0519 06/18/17 0915  BP: 130/64 134/66 139/78 (!) 147/66  Pulse: 90 88 98 96  Resp: _0 Temp: 98.2 F (36.8 C) 98.8 F (37.1 C) 98.2 F (36.8 C) 98.2 F (36.8 C)  TempSrc: Oral Oral Oral Oral  SpO2: 95% 97% 98% 97%  Weight:   57.7 kg (127 lb 1.6 oz)   Height:        Intake/Output Summary (Last 24 hours) at 06/18/2017 1258 Last data filed at 06/18/2017 0756 Gross per 24 hour  Intake 648.75 ml  Output 1200 ml  Net -551.25 ml   Filed Weights   06/16/17 0500 06/17/17 0500 06/18/17 0519  Weight: 56.2 kg (123 lb 14.4 oz) 56.3 kg (124 lb 1.9 oz) 57.7 kg (127 lb 1.6 oz)   Examination: Physical Exam:  Constitutional: Thin elderly Caucasian female who is currently in no acute distress.  Appears calm and comfortable. Eyes: Sclera are anicteric.  Lids and conjunctive are normal ENMT: External ears and nose appear normal.  Grossly normal hearing Neck: Supple with no JVD Respiratory: Diminished to auscultation bilaterally with no appreciable wheezing, rales, rhonchi.  Patient unlabored breathing and without accessory muscle breathing Cardiovascular: Regular rate and rhythm mostly on the fast side.  Has a 2 out of 6 systolic murmur however no  appreciable rubs or gallops.  No lower extremity edema noted Abdomen: Soft, lightly tender to palpation in the mid abdomen.  Nondistended.  Bowel sounds present in all 4 quadrants GU: Deferred Musculoskeletal: No contractures or cyanosis noted.  No joint deformities Skin: Skin was warm  and dry no appreciable rashes or lesions on limited skin evaluation Neurologic: Cranial nerves II through XII grossly intact no appreciable focal deficits  Psychiatric: Normal mood and affect.  Intact judgment intact.  Patient is awake, alert and oriented x3.  Data Reviewed: I have personally reviewed following labs and imaging studies  CBC: Recent Labs  Lab 06/11/17 2249  06/14/17 0523 06/15/17 0456 06/16/17 0555 06/17/17 0546 06/18/17 0557  WBC 4.9   < > 9.3 7.8 5.8 6.5 7.4  NEUTROABS 3.7  --   --  6.4 4.4 5.1 6.2  HGB 10.9*   < > 9.7* 8.8* 9.0* 9.6* 9.3*  HCT 33.1*   < > 29.8* 26.8* 26.8* 29.2* 28.0*  MCV 88.3   < > 87.6 87.0 86.5 86.4 87.0  PLT 324   < > 293 295 275 306 298   < > = values in this interval not displayed.   Basic Metabolic Panel: Recent Labs  Lab 06/14/17 0523 06/15/17 0456 06/16/17 0555 06/17/17 0546 06/18/17 0557  NA 135 132* 130* 128* 132*  K 2.9* 3.6 3.4* 4.3 4.2  CL 98* 96* 96* 96* 101  CO2 _0 21* 20*  GLUCOSE 100* 88 77 90 91  BUN 22* 24* 24* 26* 23*  CREATININE 1.78* 1.55* 1.35* 1.38* 1.29*  CALCIUM 11.2* 9.9 9.1 9.2 8.9  MG 1.1* 1.6* 1.8 1.6* 1.8  PHOS 2.8  2.9 1.4* 2.2* 2.3* 2.5   GFR: Estimated Creatinine Clearance: 27.8 mL/min (A) (by C-G formula based on SCr of 1.29 mg/dL (H)). Liver Function Tests: Recent Labs  Lab 06/12/17 1028  06/14/17 0523 06/15/17 0456 06/16/17 0555 06/17/17 0546 06/18/17 0557  AST 65*  --   --  75* 74* 73* 71*  ALT 23  --   --  _1 ALKPHOS 77  --   --  71 60 72 68  BILITOT 0.5  --   --  0.5 0.6 0.5 0.5  PROT 6.4*  --   --  5.8* 5.8* 5.7* 5.7*  ALBUMIN 3.4*  3.6   < > 3.6 3.3* 3.1* 3.1* 2.8*   < > = values in this interval not displayed.   No results for input(s): LIPASE, AMYLASE in the last 168 hours. No results for input(s): AMMONIA in the last 168 hours. Coagulation Profile: Recent Labs  Lab 06/17/17 1101  INR 0.98   Cardiac Enzymes: No results for input(s): CKTOTAL, CKMB,  CKMBINDEX, TROPONINI in the last 168 hours. BNP (last 3 results) No results for input(s): PROBNP in the last 8760 hours. HbA1C: No results for input(s): HGBA1C in the last 72 hours. CBG: No results for input(s): GLUCAP in the last 168 hours. Lipid Profile: No results for input(s): CHOL, HDL, LDLCALC, TRIG, CHOLHDL, LDLDIRECT in the last 72 hours. Thyroid Function Tests: No results for input(s): TSH, T4TOTAL, FREET4, T3FREE, THYROIDAB in the last 72 hours. Anemia Panel: Recent Labs    06/16/17 0555  VITAMINB12 793  FOLATE 13.8  FERRITIN 150  TIBC 297  IRON 34  RETICCTPCT 1.3   Sepsis Labs: No results for input(s): PROCALCITON, LATICACIDVEN in the last 168 hours.  Recent Results (from the past 240 hour(s))  Urine culture     Status: Abnormal   Collection Time: 06/12/17 12:00 AM  Result Value Ref Range Status   Specimen Description   Final    URINE, CLEAN CATCH Performed at [facility], [place] [address] [place], [place] [address]    Special Requests   Final    NONE Performed at [facility], [place] [address], [place], [place] [address]    Culture (A)  Final    <10,000 COLONIES/mL INSIGNIFICANT GROWTH Performed at [facility] [address], [place], [place] [address]    Report Status 06/13/2017 FINAL  Final    Radiology Studies: No results found. Scheduled Meds: . amLODipine  5 mg Oral Daily  . buPROPion  300 mg Oral Daily  . sucralfate  1 g Oral QID   Continuous Infusions: . sodium chloride 75 mL/hr at 06/18/17 0836    LOS: 6 days   Kerney Elbe, DO [facility] Pager [phone]  If 7PM-7AM, please contact night-coverage [email or web address] Password [facility] 06/18/2017, 12:58 PM

## 2017-06-19 ENCOUNTER — Other Ambulatory Visit (HOSPITAL_COMMUNITY): Payer: Self-pay | Admitting: Internal Medicine

## 2017-06-19 DIAGNOSIS — R591 Generalized enlarged lymph nodes: Secondary | ICD-10-CM

## 2017-06-19 DIAGNOSIS — E878 Other disorders of electrolyte and fluid balance, not elsewhere classified: Secondary | ICD-10-CM

## 2017-06-19 DIAGNOSIS — D638 Anemia in other chronic diseases classified elsewhere: Secondary | ICD-10-CM

## 2017-06-19 DIAGNOSIS — K831 Obstruction of bile duct: Secondary | ICD-10-CM

## 2017-06-19 DIAGNOSIS — G9341 Metabolic encephalopathy: Secondary | ICD-10-CM

## 2017-06-19 MED ORDER — AMLODIPINE BESYLATE 5 MG PO TABS: 5.0000 mg | ORAL_TABLET | Freq: Every day | ORAL | 0 refills | Status: AC

## 2017-06-19 MED ORDER — ACETAMINOPHEN 325 MG PO TABS: 650.0000 mg | ORAL_TABLET | Freq: Four times a day (QID) | ORAL | Status: AC | PRN

## 2017-06-19 NOTE — Plan of Care (Signed)
  Problem: Education: Goal: Knowledge of General Education information will improve Outcome: Progressing   Problem: Health Behavior/Discharge Planning: Goal: Ability to manage health-related needs will improve Outcome: Progressing   Problem: Clinical Measurements: Goal: Ability to maintain clinical measurements within normal limits will improve Outcome: Progressing Goal: Will remain free from infection Outcome: Progressing Goal: Diagnostic test results will improve Outcome: Progressing Goal: Respiratory complications will improve Outcome: Progressing Goal: Cardiovascular complication will be avoided Outcome: Progressing   Problem: Activity: Goal: Risk for activity intolerance will decrease Outcome: Progressing   Problem: Nutrition: Goal: Adequate nutrition will be maintained Outcome: Progressing   Problem: Coping: Goal: Level of anxiety will decrease Outcome: Progressing   Problem: Elimination: Goal: Will not experience complications related to urinary retention Outcome: Progressing   Problem: Pain Managment: Goal: General experience of comfort will improve Outcome: Progressing

## 2017-06-19 NOTE — Care Management Note (Signed)
Case Management Note  Patient Details  Name: Monique Crosby MRN: [id number] Date of Birth: 1933-02-07  Subjective/Objective: PT recc HHPT. Ordered for HHRn/PT/aide-patient defers to spouse-TC spouse about HHC agency-will await choice. [facility] dme rep will deliver rw,3n1 to rm prior d/c.                  Action/Plan:d/c home w/HHC/dme.   Expected Discharge Date:  (unknown)               Expected Discharge Plan:  [place]  In-House Referral:     Discharge planning Services  CM Consult  Post Acute Care Choice:    Choice offered to:  Spouse  DME Arranged:  3-N-1, Walker rolling DME Agency:  [place]:  RN, PT, Nurse's Aide [place] Agency:     Status of Service:  In process, will continue to follow  If discussed at Long Length of Stay Meetings, dates discussed:    Additional Comments:  Dessa Phi, RN 06/19/2017, 3:21 PM

## 2017-06-19 NOTE — Care Management Note (Signed)
Case Management Note  Patient Details  Name: [name] MRN: [id number] Date of Birth: 1932/11/16  Subjective/Objective:  Spoke to spouse on phone-chose [facility] rep Santiago Glad aware of [place], & dme. No further CM needs.                  Action/Plan:d/c home w/HHC/dme.   Expected Discharge Date:  06/19/17               Expected Discharge Plan:  [place]  In-House Referral:     Discharge planning Services  CM Consult  Post Acute Care Choice:    Choice offered to:  Spouse  DME Arranged:  3-N-1, Walker rolling DME Agency:  [place]:  RN, PT, Nurse's Aide [place] Agency:  [place]  Status of Service:  Completed, signed off  If discussed at [place] of Stay Meetings, dates discussed:    Additional Comments:  Dessa Phi, RN 06/19/2017, 3:31 PM

## 2017-06-19 NOTE — Evaluation (Signed)
Physical Therapy Evaluation Patient Details Name: Monique Crosby MRN: [id number] DOB: [birth year] Today's Date: 06/19/2017   History of Present Illness  84 y.o. female, w h/o breast cancer (DCIS), Rheumatoid arthritis, Osteopenia, GERD who presents with hypercalcemia.  Pt was admitted for hypercalcemia and ARF and found to have Abdominal Masses and ? pancreatic lesion with likely metastasis.  Work-up was underway for suspected pancreatic malignancy and Oncology is following.   Clinical Impression  Pt admitted with above diagnosis. Pt currently with functional limitations due to the deficits listed below (see PT Problem List). Pt will benefit from skilled PT to increase their independence and safety with mobility to allow discharge to the venue listed below.  Pt assisted with ambulating in hallway and mostly min/guard however one instance of min assist for LOB.  Spouse present and observed and states he can assist pt at home. Provided gait belt to spouse for pt safety upon d/c.  Possible d/c home later today per RN.  Recommend HHPT, RW, and BSC.     Follow Up Recommendations Home health PT;Supervision for mobility/OOB    Equipment Recommendations  Rolling walker with 5" wheels;3in1 (PT)    Recommendations for Other Services       Precautions / Restrictions Precautions Precautions: Fall      Mobility  Bed Mobility               General bed mobility comments: pt up in recliner on arrival  Transfers Overall transfer level: Needs assistance Equipment used: Rolling walker (2 wheeled) Transfers: Sit to/from Stand Sit to Stand: Min guard         General transfer comment: verbal cues for hand placement, min/guard for safety  Ambulation/Gait Ambulation/Gait assistance: Min assist Ambulation Distance (Feet): 160 Feet Assistive device: Rolling walker (2 wheeled) Gait Pattern/deviations: Step-through pattern;Decreased stride length     General Gait Details: verbal cues for RW  positioning and step length, distance to tolerance, one instance of min assist for initial instability otherwise min/guard  Stairs            Wheelchair Mobility    Modified Rankin (Stroke Patients Only)       Balance Overall balance assessment: Mild deficits observed, not formally tested                                           Pertinent Vitals/Pain Pain Assessment: No/denies pain    Home Living Family/patient expects to be discharged to:: Private residence Living Arrangements: Spouse/significant other   Type of Home: Apartment       Home Layout: One level Home Equipment: None      Prior Function Level of Independence: Independent               Hand Dominance        Extremity/Trunk Assessment        Lower Extremity Assessment Lower Extremity Assessment: Generalized weakness       Communication   Communication: No difficulties  Cognition Arousal/Alertness: Awake/alert Behavior During Therapy: WFL for tasks assessed/performed Overall Cognitive Status: Within Functional Limits for tasks assessed                                        General Comments      Exercises  Assessment/Plan    PT Assessment Patient needs continued PT services  PT Problem List Decreased strength;Decreased mobility;Decreased activity tolerance;Decreased balance;Decreased knowledge of use of DME       PT Treatment Interventions Gait training;Therapeutic exercise;Patient/family education;DME instruction;Therapeutic activities;Functional mobility training;Balance training    PT Goals (Current goals can be found in the Care Plan section)  Acute Rehab PT Goals PT Goal Formulation: With patient/family Time For Goal Achievement: 06/26/17 Potential to Achieve Goals: Good    Frequency Min 3X/week   Barriers to discharge        Co-evaluation               AM-PAC PT "6 Clicks" Daily Activity  Outcome Measure  Difficulty turning over in bed (including adjusting bedclothes, sheets and blankets)?: A Little Difficulty moving from lying on back to sitting on the side of the bed? : A Little Difficulty sitting down on and standing up from a chair with arms (e.g., wheelchair, bedside commode, etc,.)?: A Little Help needed moving to and from a bed to chair (including a wheelchair)?: A Little Help needed walking in hospital room?: A Little Help needed climbing 3-5 steps with a railing? : A Lot 6 Click Score: 17    End of Session Equipment Utilized During Treatment: Gait belt Activity Tolerance: Patient tolerated treatment well Patient left: in chair;with call bell/phone within reach;with family/visitor present Nurse Communication: Mobility status PT Visit Diagnosis: Other abnormalities of gait and mobility (R26.89)    Time: 4287-6811 PT Time Calculation (min) (ACUTE ONLY): 16 min   Charges:   PT Evaluation $PT Eval Low Complexity: 1 Low     PT G CodesCarmelia Bake, PT, DPT 06/19/2017 Pager: [phone]   York Ram E 06/19/2017, 3:08 PM

## 2017-06-19 NOTE — Discharge Summary (Signed)
Physician Discharge Summary  Monique Crosby ZOX:[id number] DOB: 10/22/1932 DOA: 06/11/2017  PCP: Monique Contras, MD  Admit date: 06/11/2017 Discharge date: 06/19/2017  Admitted From: home Disposition:  home   Recommendations for Outpatient Follow-up:  1. Dr Lebron Conners will follow up on biopsy results and plans to see her on Friday in the office 2.  follow-up on calcium, electrolytes and renal function  Home Health:  ordered Equipment/Devices:  [facility]    Discharge Condition:  stable   CODE STATUS:  DNR   Diet recommendation:  Regular diet Consultations:  Med Oncology   IR  Phone discussion with GI   Discharge Diagnoses:  Principal Problem:   Hypercalcemia Active Problems:   Lymphadenopathy   Anemia chronic disease   ARF (acute renal failure) (HCC) Electrolyte abnormalities Rheumatoid arthritis Hypertension     Subjective: She has no complaints today.  Was able to get up to the bedside commode with minimal assistance from the nurse this morning. She states she is looking forward to going home.  Brief Summary: JoanneYostis a84 y.o.female,w h/o breast cancer (DCIS), Rheumatoid arthritis, Osteopenia, GERD who presents with hypercalcemia. She was sent to the emergency room via PCP for hypercalcemia >15 and was found to have confusion and AKI.    Hospital Course:   Hypercalcemia likely of Malignancy   Ref. Range 06/12/2017 01:14 06/12/2017 05:17  Vit D, 1,25-Dihydroxy Latest Ref Range: 19.9 - 79.3 pg/mL  190.0 (H)  Vitamin D, 25-Hydroxy Latest Ref Range: 30.0 - 100.0 ng/mL 32.2         -PTH is suppressed at 12 -PTH related peptide is less than 2.0 -The following was done for hypercalcemia: IV fluids, calcitonin, zoledronic acid -Calcium improved from > on admission 15 to 8.9  Lymphadenopathy -Has been evaluated by oncology, Dr. Lebron Conners who plans to see the on Friday (in 2 days) once biopsy has resulted  -U/S abdomen showed: "Retroperitoneal mass lesions in the  splenic hilar region,adjacent to the pancreas, and possibly within the tail of the pancreas. These lesions likely represent enlarged lymph nodes or possibly primary tumors. Differential diagnosis would include metastasis, pancreatic carcinoma, and/or lymphoma. Suggest CT abdomen and pelvis for further evaluation" CT not able to be performed due to AKI and therefore MRCP obtained which showed bulky upper abdominal and retroperitoneal lymphadenopathy suspicious for lymphoma  -Bone scan showed multifocal areas of increased activity in the cervical thoracic and lumbar spine and questionable areas of his activity over anterior ribs bilaterally -IR was requested to do lymph node biopsy which was done on 4/30 (CT-guided core biopsy of left para-aortic retroperitoneal lymph nodes palliative care) -Palliative care has been consulted and will wait on the patient's discussion with Dr. Lebron Conners before making further suggestions -As mentioned above, I have spoken with Dr. Lebron Conners today plans on following up with her in 2 days after biopsy results are obtained  Acute encephalopathy- metabolic  -has resolved with improvement in calcium  AKI -?  Due to hypercalcemia/ dehydration -Creatinine 2.05 on admission has improved to 1.29 today-baseline is about 1.0-1.1  Anemia of chronic disease -Results for AAVA, DELAND (MRN [id number]) as of 06/19/2017 15:05  Ref. Range 06/16/2017 05:55  Iron Latest Ref Range: 28 - 170 ug/dL 34  UIBC Latest Units: ug/dL 263  TIBC Latest Ref Range: 250 - 450 ug/dL 297  Saturation Ratios Latest Ref Range: 10.4 - 31.8 % 11  Ferritin Latest Ref Range: 11 - 307 ng/mL 150  Folate Latest Ref Range: >5.9 ng/mL 13.8  Vitamin B12 Latest Ref Range: 180 - 914 pg/mL   793   Hypertension -Losartan has been held due to AKI-will continue to hold -Amlodipine added at 5 mg  Multiple electrolyte abnormalities -Hypokalemia, hypophosphatemia, hypomagnesemia and hyponatremia -Treated and  improved  History of breast cancer  Pyuria -Initially treated with Keflex suspicion of UTI -Urine culture showed less than 10,000 colonies of unknown organism and antibiotics discontinued  Rheumatoid arthritis -Apparently has a prescription for Percocet however does not use it  Discharge Exam: Vitals:   06/19/17 0454 06/19/17 1316  BP: 139/68 140/69  Pulse: 96 100  Resp: (!) 22 18  Temp: 98.2 F (36.8 C) 98.6 F (37 C)  SpO2: 95% 96%   Vitals:   06/18/17 2151 06/19/17 0454 06/19/17 0456 06/19/17 1316  BP: (!) 154/79 139/68  140/69  Pulse: (!) 101 96  100  Resp: 20 (!) 22  18  Temp: (!) 97.5 F (36.4 C) 98.2 F (36.8 C)  98.6 F (37 C)  TempSrc: Oral Oral  Oral  SpO2: 96% 95%  96%  Weight:   58.3 kg (128 lb 8.5 oz)   Height:        General: Pt is alert, awake, alert and oriented, not in acute distress Cardiovascular: RRR, S1/S2 +, no rubs, no gallops Respiratory: CTA bilaterally, no wheezing, no rhonchi Abdominal: Soft, NT, ND, bowel sounds + Extremities: no edema, no cyanosis   Discharge Instructions  Discharge Instructions    Ambulatory referral to Hematology / Oncology   Complete by:  As directed    Diet general   Complete by:  As directed    Regular diet   Increase activity slowly   Complete by:  As directed      Allergies as of 06/19/2017      Reactions   Amoxicillin Other (See Comments)   Severe yeast infections   Reglan [metoclopramide] Other (See Comments)   Chest pain      Medication List    STOP taking these medications   cephALEXin 500 MG capsule Commonly known as:  KEFLEX   losartan 100 MG tablet Commonly known as:  COZAAR     TAKE these medications   acetaminophen 325 MG tablet Commonly known as:  TYLENOL Take 2 tablets (650 mg total) by mouth every 6 (six) hours as needed for mild pain (or Fever >/= 101).   ALPRAZolam 1 MG tablet Commonly known as:  XANAX TAKE 1 TABLET EVERY DAY as needed   amLODipine 5 MG tablet Commonly  known as:  NORVASC Take 1 tablet (5 mg total) by mouth daily. Start taking on:  06/20/2017   buPROPion 300 MG 24 hr tablet Commonly known as:  WELLBUTRIN XL Take 1 tablet (300 mg total) by mouth daily.   oxyCODONE-acetaminophen 5-325 MG tablet Commonly known as:  PERCOCET/ROXICET Take 1 tablet by mouth every 4 (four) hours as needed.   sucralfate 1 g tablet Commonly known as:  CARAFATE TAKE 1 TABLET BY MOUTH 4 TIMES A DAY            Durable Medical Equipment  (From admission, onward)        Start     Ordered   06/19/17 1523  For home use only DME Walker rolling  [facility])  Once    Question:  Patient needs a walker to treat with the following condition  Answer:  Muscular deconditioning   06/19/17 1525   06/19/17 1521  For home use only DME 3 n 1  Once     06/19/17 1520   06/19/17 1520  For home use only DME Walker rolling  Once    Question:  Patient needs a walker to treat with the following condition  Answer:  Unsteady gait   06/19/17 1520     Follow-up Information    Ardath Sax, MD Follow up.   Specialty:  Hematology and Oncology Why:  I have spoken with him today. He will fit you in for a vist on Friday. His office will call you. Contact information: [place] [address] [phone]        Monique Contras, MD. Schedule an appointment as soon as possible for a visit in 1 week(s).   Specialty:  Family Medicine Why:  1 week Contact information: [place], [address] [place] [address] [phone]          Allergies  Allergen Reactions  . Amoxicillin Other (See Comments)    Severe yeast infections  . Reglan [Metoclopramide] Other (See Comments)    Chest pain     Procedures/Studies:    Ct Abdomen Pelvis Wo Contrast  Result Date: 06/18/2017 CLINICAL DATA:  84 year old female with breast cancer and rheumatoid arthritis. Presenting with hypercalcemia. Prior hysterectomy, cholecystectomy and appendectomy.  Subsequent encounter. EXAM: CT ABDOMEN AND PELVIS WITHOUT CONTRAST TECHNIQUE: Multidetector CT imaging of the abdomen and pelvis was performed following the standard protocol without IV contrast. COMPARISON:  06/15/2017 MR abdomen. Four hundred twenty-five 2019 bone scan. 06/12/2017 abdominal sonogram. FINDINGS: Lower chest: Small bilateral pleural effusions larger on left. Mild left base atelectasis. Heart size within normal limits. Coronary artery and mitral valve calcifications. Epicardial adenopathy. Hepatobiliary: Taking into account limitation by non contrast imaging, no worrisome hepatic lesion. Post cholecystectomy. No calcified common bile duct stone. Pancreas: Evaluation of the pancreas is limited secondary to lack of contrast and upper abdominal adenopathy. Spleen: Taking into account limitation by non contrast imaging, no splenic mass or enlargement. Adrenals/Urinary Tract: Taking into account limitation by non contrast imaging, no renal or adrenal mass. No obstructing stone or hydronephrosis. Noncontrast filled views of the urinary bladder unremarkable. Stomach/Bowel: Evaluation limited by lack of contrast, lack of fat planes, under distension, distortion by adenopathy and ascites. Scattered colonic diverticula. Vascular/Lymphatic: Bulky retroperitoneal and upper abdominal adenopathy (gastrohepatic, porta hepatis, celiac axis and gastrosplenic region). Retroperitoneal adenopathy and fell ups aorta and inferior vena cava. Atherosclerotic changes abdominal aorta with ectasia with maximal transverse dimension of 2.7 cm. Atherosclerotic changes femoral arteries and iliac arteries. Reproductive: Prior hysterectomy.  Ovaries not clearly delineated. Other: Peritoneal spread of tumor. Ascites. No free intraperitoneal air. Musculoskeletal: Degenerative changes lumbar spine most notable L3-4 through L5-S1. Mild scoliosis. Mild hip joint degenerative changes. IMPRESSION: Bulky retroperitoneal and upper abdominal  adenopathy. Epicardial adenopathy. Findings suspicious for tumor possibly lymphoma although other types of tumor not excluded. Peritoneal spread of tumor. Ascites. Pleural effusions. Post hysterectomy.  Ovaries not clearly delineated. Limited evaluation of pancreas secondary to lack of contrast and upper abdominal adenopathy. Aortic Atherosclerosis (ICD10-I70.0). Ectatic abdominal aorta measures up to 2.7 cm. Ectatic abdominal aorta at risk for aneurysm development. Recommend followup by ultrasound in 5 years. This recommendation follows ACR consensus guidelines: White Paper of the ACR Incidental Findings Committee II on Vascular Findings. J Am Coll Radiol 2013; 10:789-794. Coronary artery calcifications. Electronically Signed   By: Genia Del M.D.   On: 06/18/2017 13:36   Dg Chest 2 View  Result Date: 06/12/2017 CLINICAL DATA:  Hyperglycemia, confusion, high  blood pressure. EXAM: CHEST - 2 VIEW COMPARISON:  01/31/2011 FINDINGS: Hyperinflation suggesting emphysema. Mild cardiac enlargement. No vascular congestion, edema, or consolidation. Linear scarring and calcified granulomas in the upper lungs likely representing postinflammatory change no blunting of costophrenic angles no pneumothorax. Mediastinal contours appear intact. Degenerative changes in the spine and shoulders. Postoperative changes in both shoulders. Surgical clips in the left breast. Aortic calcification. IMPRESSION: Emphysematous changes in the lungs. Chronic postinflammatory changes in the apices. No evidence of active pulmonary disease. Aortic atherosclerosis. Electronically Signed   By: Lucienne Capers M.D.   On: 06/12/2017 01:59   Mr Brain Wo Contrast  Result Date: 06/15/2017 CLINICAL DATA:  Encephalopathy, and altered level of consciousness. History of breast cancer. EXAM: MRI HEAD WITHOUT CONTRAST TECHNIQUE: Multiplanar, multiecho pulse sequences of the brain and surrounding structures were obtained without intravenous contrast.  COMPARISON:  CT head 02/28/2013. FINDINGS: Brain: No evidence for acute infarction, hemorrhage, mass lesion, hydrocephalus, or extra-axial fluid. Advanced atrophy. Mild to moderate subcortical and periventricular T2 and FLAIR hyperintensities, likely chronic microvascular ischemic change. Vascular: Flow voids are maintained throughout the carotid, basilar, and vertebral arteries. There are no areas of chronic hemorrhage. Skull and upper cervical spine: Normal marrow signal. Cervical spondylosis. Sinuses/Orbits: Negative. Other: None. Compared with prior CT, there is progression of atrophy. IMPRESSION: Atrophy and small vessel disease.  No acute intracranial findings. Progression of atrophy since 2015. Electronically Signed   By: Staci Righter M.D.   On: 06/15/2017 14:24   Nm Bone Scan Whole Body  Result Date: 06/13/2017 CLINICAL DATA:  Breast cancer. EXAM: NUCLEAR MEDICINE WHOLE BODY BONE SCAN TECHNIQUE: Whole body anterior and posterior images were obtained approximately 3 hours after intravenous injection of radiopharmaceutical. RADIOPHARMACEUTICALS:  21.4 mCi Technetium-65m MDP IV COMPARISON:  Chest x-ray 06/13/2017.  CT 02/01/2011. FINDINGS: Bilateral renal function and excretion. Multifocal mild areas of increased activity noted about the cervical, thoracic, and lumbar spine. Questionable mild areas of increased activity noted and the anterior ribs bilaterally. More intense area of increased activity noted over the left lower lumbar spine. IMPRESSION: Multifocal areas of increased activity noted over the cervical, thoracic, and lumbar spine particularly over the lower left lumbar spine. Questionable areas of increased activity noted over anterior ribs bilaterally. These areas could represent metastatic disease. Electronically Signed   By: Marcello Moores  Register   On: 06/13/2017 15:10   US Abdomen Complete  Result Date: 06/12/2017 CLINICAL DATA:  Acute renal failure. Abnormal liver function. History of breast  cancer. EXAM: ABDOMEN ULTRASOUND COMPLETE COMPARISON:  None. FINDINGS: Gallbladder: The gallbladder is surgically absent. Common bile duct: Diameter: 6 mm, normal Liver: No focal lesion identified. Within normal limits in parenchymal echogenicity. Portal vein is patent on color Doppler imaging with normal direction of blood flow towards the liver. Minimal fluid around the age of the liver. Small right pleural effusion. IVC: No abnormality visualized. Pancreas: There appears to be a mass lesion in or adjacent to the tail of the pancreas measuring about 2 cm maximal diameter. Additional nodules in the retroperitoneum and around the pancreas likely represent enlarged lymph nodes. Heterogeneous mass in the region of the splenic hilum measures up to 6.7 cm diameter. No pancreatic ductal dilatation or peripancreatic fluid. Spleen: Size and appearance within normal limits. Right Kidney: Length: 9.8 cm. Increased parenchymal echotexture consistent with chronic medical renal disease. No mass or hydronephrosis visualized. Left Kidney: Length: 10 cm. Increased parenchymal echotexture consistent with chronic medical renal disease. No mass or hydronephrosis visualized. Abdominal aorta: No aneurysm  visualized. Other findings: None. IMPRESSION: 1. Retroperitoneal mass lesions in the splenic hilar region, adjacent to the pancreas, and possibly within the tail of the pancreas. These lesions likely represent enlarged lymph nodes or possibly primary tumors. Differential diagnosis would include metastasis, pancreatic carcinoma, and/or lymphoma. Suggest CT abdomen and pelvis for further evaluation. 2. Increased renal parenchymal echotexture bilaterally consistent with chronic medical renal disease. No hydronephrosis. 3. Small right pleural effusion. Small amount of upper abdominal ascites. Electronically Signed   By: Lucienne Capers M.D.   On: 06/12/2017 06:51   Mr Abdomen Mrcp Wo Contrast  Result Date: 06/16/2017 CLINICAL DATA:   Retroperitoneal mass on recent ultrasound. Renal insufficiency. Personal history of left breast carcinoma. EXAM: MRI ABDOMEN WITHOUT CONTRAST  (INCLUDING MRCP) TECHNIQUE: Multiplanar multisequence MR imaging of the abdomen was performed. Heavily T2-weighted images of the biliary and pancreatic ducts were obtained, and three-dimensional MRCP images were rendered by post processing. COMPARISON:  Ultrasound on 06/12/2017 FINDINGS: Image degradation by respiratory motion artifact noted. Lower chest: Small bilateral pleural effusions bibasilar atelectasis. Hepatobiliary: No masses visualized on this unenhanced exam. Prior cholecystectomy. Mild biliary ductal dilatation is noted, however no evidence of choledocholithiasis or other obstructing etiology. This likely related to prior cholecystectomy. Pancreas: No definite pancreatic mass on this unenhanced exam. No evidence of pancreatic ductal dilatation or pancreas divisum. Spleen:  Within normal limits in size. Adrenals/Urinary tract: Unremarkable. No evidence of hydronephrosis. Stomach/Bowel: Visualized portion unremarkable. Vascular/Lymphatic: Bulky lymphadenopathy is seen in the upper abdomen within the gastrohepatic and gastrosplenic ligaments, left cardiophrenic angle, and porta hepatis. Bulky lymphadenopathy is also seen throughout the abdominal retroperitoneum. Index area in the retrocaval space measures 3.3 cm in short axis on image 31/10. Other: Minimal perihepatic ascites and mild diffuse mesenteric and abdominal wall edema. Musculoskeletal:  No suspicious bone lesions identified. IMPRESSION: Bulky upper abdominal and retroperitoneal lymphadenopathy, suspicious for lymphoma with metastatic disease considered less likely. No evidence of hydronephrosis. Electronically Signed   By: Earle Gell M.D.   On: 06/16/2017 08:15   Ct Biopsy  Result Date: 06/18/2017 INDICATION: 84 year old with prior history of breast cancer. Patient has extensive abdominal  lymphadenopathy along with evidence of peritoneal disease and some ascites. Patient needs a tissue diagnosis. EXAM: CT-GUIDED BIOPSY OF RETROPERITONEAL LYMPH NODE MEDICATIONS: None. ANESTHESIA/SEDATION: Moderate (conscious) sedation was employed during this procedure. A total of Versed 2.5 mg and Fentanyl 150 mcg was administered intravenously. Moderate Sedation Time: 33 minutes. The patient's level of consciousness and vital signs were monitored continuously by radiology nursing throughout the procedure under my direct supervision. FLUOROSCOPY TIME:  None COMPLICATIONS: None immediate. PROCEDURE: Informed written consent was obtained from the patient after a thorough discussion of the procedural risks, benefits and alternatives. All questions were addressed. A timeout was performed prior to the initiation of the procedure. Patient was placed prone on the CT scanner. Images through the abdomen obtained. Left periaortic retroperitoneal lymph node was targeted. The left side of back was prepped with chlorhexidine and sterile field was created. Skin and soft tissues were anesthetized with 1% lidocaine. Using CT guidance, 17 gauge coaxial needle was directed into the left retroperitoneal lymph node. Total of 5 core biopsies were obtained with an 18 gauge core device. Specimens placed in saline. 17 gauge needle was removed without complication. Bandage placed over the puncture site. FINDINGS: Bulky retroperitoneal lymphadenopathy around the aorta. Left periaortic lymph node was sampled at the level of the lower left kidney. No significant bleeding or hematoma formation following the core biopsies. IMPRESSION:  CT-guided core biopsies of a left retroperitoneal lymph node. Electronically Signed   By: Markus Daft M.D.   On: 06/18/2017 16:56   Dg Chest Port 1 View  Result Date: 06/13/2017 CLINICAL DATA:  Acute respiratory failure. EXAM: PORTABLE CHEST 1 VIEW COMPARISON:  Chest x-ray 06/12/2017. FINDINGS: Mediastinum and  hilar structures normal. Cardiomegaly. New onset diffuse bilateral pulmonary infiltrates/edema bilateral pleural effusions. No pneumothorax. IMPRESSION: Cardiomegaly. New onset of diffuse bilateral pulmonary infiltrates/edema. Small bilateral pleural effusions. Findings most consistent with CHF. Electronically Signed   By: Marcello Moores  Register   On: 06/13/2017 14:48     The results of significant diagnostics from this hospitalization (including imaging, microbiology, ancillary and laboratory) are listed below for reference.     Microbiology: Recent Results (from the past 240 hour(s))  Urine culture     Status: Abnormal   Collection Time: 06/12/17 12:00 AM  Result Value Ref Range Status   Specimen Description   Final    URINE, CLEAN CATCH Performed at [facility], [place] [address], [place], [place] [address]    Special Requests   Final    NONE Performed at [facility], [place] [address] [place], [place] [address]    Culture (A)  Final    <10,000 COLONIES/mL INSIGNIFICANT GROWTH Performed at [facility] [address], [place], [place] [address]    Report Status 06/13/2017 FINAL  Final     Labs: BNP (last 3 results) No results for input(s): BNP in the last 8760 hours. Basic Metabolic Panel: Recent Labs  Lab 06/14/17 0523 06/15/17 0456 06/16/17 0555 06/17/17 0546 06/18/17 0557  NA 135 132* 130* 128* 132*  K 2.9* 3.6 3.4* 4.3 4.2  CL 98* 96* 96* 96* 101  CO2 23 23 23  21* 20*  GLUCOSE 100* 88 77 90 91  BUN 22* 24* 24* 26* 23*  CREATININE 1.78* 1.55* 1.35* 1.38* 1.29*  CALCIUM 11.2* 9.9 9.1 9.2 8.9  MG 1.1* 1.6* 1.8 1.6* 1.8  PHOS 2.8  2.9 1.4* 2.2* 2.3* 2.5   Liver Function Tests: Recent Labs  Lab 06/14/17 0523 06/15/17 0456 06/16/17 0555 06/17/17 0546 06/18/17 0557  AST  --  75* 74* 73* 71*  ALT  --  20 21 21 23   ALKPHOS  --  71 60 72 68  BILITOT  --  0.5 0.6 0.5 0.5  PROT  --  5.8* 5.8* 5.7* 5.7*  ALBUMIN 3.6 3.3* 3.1*  3.1* 2.8*   No results for input(s): LIPASE, AMYLASE in the last 168 hours. No results for input(s): AMMONIA in the last 168 hours. CBC: Recent Labs  Lab 06/14/17 0523 06/15/17 0456 06/16/17 0555 06/17/17 0546 06/18/17 0557  WBC 9.3 7.8 5.8 6.5 7.4  NEUTROABS  --  6.4 4.4 5.1 6.2  HGB 9.7* 8.8* 9.0* 9.6* 9.3*  HCT 29.8* 26.8* 26.8* 29.2* 28.0*  MCV 87.6 87.0 86.5 86.4 87.0  PLT 293 295 275 306 298   Cardiac Enzymes: No results for input(s): CKTOTAL, CKMB, CKMBINDEX, TROPONINI in the last 168 hours. BNP: Invalid input(s): POCBNP CBG: No results for input(s): GLUCAP in the last 168 hours. D-Dimer No results for input(s): DDIMER in the last 72 hours. Hgb A1c No results for input(s): HGBA1C in the last 72 hours. Lipid Profile No results for input(s): CHOL, HDL, LDLCALC, TRIG, CHOLHDL, LDLDIRECT in the last 72 hours. Thyroid function studies No results for input(s): TSH, T4TOTAL, T3FREE, THYROIDAB in the last 72 hours.  Invalid input(s): FREET3 Anemia work  up No results for input(s): VITAMINB12, FOLATE, FERRITIN, TIBC, IRON, RETICCTPCT in the last 72 hours. Urinalysis    Component Value Date/Time   COLORURINE YELLOW 06/12/2017 0000   APPEARANCEUR HAZY (A) 06/12/2017 0000   LABSPEC 1.013 06/12/2017 0000   PHURINE 5.0 06/12/2017 0000   GLUCOSEU NEGATIVE 06/12/2017 0000   HGBUR SMALL (A) 06/12/2017 0000   HGBUR small 07/15/2007 1612   BILIRUBINUR NEGATIVE 06/12/2017 0000   KETONESUR NEGATIVE 06/12/2017 0000   PROTEINUR NEGATIVE 06/12/2017 0000   UROBILINOGEN 0.2 12/24/2008 1132   NITRITE NEGATIVE 06/12/2017 0000   LEUKOCYTESUR SMALL (A) 06/12/2017 0000   Sepsis Labs Invalid input(s): PROCALCITONIN,  WBC,  LACTICIDVEN Microbiology Recent Results (from the past 240 hour(s))  Urine culture     Status: Abnormal   Collection Time: 06/12/17 12:00 AM  Result Value Ref Range Status   Specimen Description   Final    URINE, CLEAN CATCH Performed at [facility], [place] [address], [place], [place] [address]    Special Requests   Final    NONE Performed at [facility], [place] [address], [place], [place] [address]    Culture (A)  Final    <10,000 COLONIES/mL INSIGNIFICANT GROWTH Performed at [facility] [address], [place], [place] [address]    Report Status 06/13/2017 FINAL  Final     Time coordinating discharge: 65 minutes  SIGNED:   Debbe Odea, MD  [facility] 06/19/2017, 3:29 PM Pager   If 7PM-7AM, please contact night-coverage [email or web address] Password [email or web address]

## 2017-06-19 NOTE — Discharge Instructions (Signed)
Please hold off on taking your losartan for now.  You have been started on amlodipine instead. Drink plenty of water to stay hydrated-at least 1 to 2 L a day. I have spoken with Dr. follow-up today states he will fit you in for an appointment on Friday once the biopsy results have returned.  His office should be calling you.  You were cared for by a hospitalist during your hospital stay. If you have any questions about your discharge medications or the care you received while you were in the hospital after you are discharged, you can call the unit and asked to speak with the hospitalist on call if the hospitalist that took care of you is not available. Once you are discharged, your primary care physician will handle any further medical issues. Please note that NO REFILLS for any discharge medications will be authorized once you are discharged, as it is imperative that you return to your primary care physician (or establish a relationship with a primary care physician if you do not have one) for your aftercare needs so that they can reassess your need for medications and monitor your lab values.  Please take all your medications with you for your next visit with your Primary MD. Please ask your Primary MD to get all Hospital records sent to his/her office. Please request your Primary MD to go over all hospital test results at the follow up.    If you experience worsening of your admission symptoms, develop shortness of breath, chest pain, suicidal or homicidal thoughts or a life threatening emergency, you must seek medical attention immediately by calling 911 or calling your MD.   Dennis Bast must read the complete instructions/literature along with all the possible adverse reactions/side effects for all the medicines you take including new medications that have been prescribed to you. Take new medicines after you have completely understood and accpet all the possible adverse reactions/side effects.    Do not  drive when taking pain medications or sedatives.     Do not take more than prescribed Pain, Sleep and Anxiety Medications   If you have smoked or chewed Tobacco in the last 2 yrs please stop. Stop any regular alcohol  and or recreational drug use.   Wear Seat belts while driving.

## 2017-06-21 ENCOUNTER — Inpatient Hospital Stay: Payer: Medicare Other | Attending: Hematology and Oncology | Admitting: Hematology and Oncology

## 2017-06-21 ENCOUNTER — Encounter: Payer: Self-pay | Admitting: Hematology and Oncology

## 2017-06-21 ENCOUNTER — Inpatient Hospital Stay: Payer: Medicare Other

## 2017-06-21 ENCOUNTER — Telehealth: Payer: Self-pay

## 2017-06-21 VITALS — BP 118/64 | HR 97 | Temp 97.5°F | Resp 17 | Ht 63.0 in | Wt 129.3 lb

## 2017-06-21 DIAGNOSIS — Z801 Family history of malignant neoplasm of trachea, bronchus and lung: Secondary | ICD-10-CM

## 2017-06-21 DIAGNOSIS — C8510 Unspecified B-cell lymphoma, unspecified site: Secondary | ICD-10-CM

## 2017-06-21 DIAGNOSIS — Z87891 Personal history of nicotine dependence: Secondary | ICD-10-CM | POA: Diagnosis not present

## 2017-06-21 DIAGNOSIS — Z853 Personal history of malignant neoplasm of breast: Secondary | ICD-10-CM | POA: Insufficient documentation

## 2017-06-21 DIAGNOSIS — Z923 Personal history of irradiation: Secondary | ICD-10-CM | POA: Insufficient documentation

## 2017-06-21 DIAGNOSIS — K5900 Constipation, unspecified: Secondary | ICD-10-CM | POA: Insufficient documentation

## 2017-06-21 DIAGNOSIS — R109 Unspecified abdominal pain: Secondary | ICD-10-CM | POA: Insufficient documentation

## 2017-06-21 DIAGNOSIS — R530 Neoplastic (malignant) related fatigue: Secondary | ICD-10-CM | POA: Diagnosis not present

## 2017-06-21 DIAGNOSIS — R634 Abnormal weight loss: Secondary | ICD-10-CM

## 2017-06-21 DIAGNOSIS — Z79899 Other long term (current) drug therapy: Secondary | ICD-10-CM | POA: Diagnosis not present

## 2017-06-21 DIAGNOSIS — Z800 Family history of malignant neoplasm of digestive organs: Secondary | ICD-10-CM | POA: Diagnosis not present

## 2017-06-21 DIAGNOSIS — M549 Dorsalgia, unspecified: Secondary | ICD-10-CM | POA: Diagnosis not present

## 2017-06-21 DIAGNOSIS — E46 Unspecified protein-calorie malnutrition: Secondary | ICD-10-CM | POA: Diagnosis not present

## 2017-06-21 LAB — CMP (CANCER CENTER ONLY)
ALK PHOS: 100 U/L (ref 40–150)
ALT: 35 U/L (ref 0–55)
AST: 95 U/L — ABNORMAL HIGH (ref 5–34)
Albumin: 3.1 g/dL — ABNORMAL LOW (ref 3.5–5.0)
Anion gap: 8 (ref 3–11)
BUN: 38 mg/dL — ABNORMAL HIGH (ref 7–26)
CALCIUM: 10 mg/dL (ref 8.4–10.4)
CO2: 18 mmol/L — AB (ref 22–29)
CREATININE: 2.36 mg/dL — AB (ref 0.60–1.10)
Chloride: 100 mmol/L (ref 98–109)
GFR, EST AFRICAN AMERICAN: 21 mL/min — AB (ref >60)
GFR, Estimated: 18 mL/min — ABNORMAL LOW (ref >60)
Glucose, Bld: 80 mg/dL (ref 70–140)
Potassium: 4.4 mmol/L (ref 3.5–5.1)
SODIUM: 126 mmol/L — AB (ref 136–145)
Total Bilirubin: 0.2 mg/dL (ref 0.2–1.2)
Total Protein: 6 g/dL — ABNORMAL LOW (ref 6.4–8.3)

## 2017-06-21 LAB — CBC WITH DIFFERENTIAL (CANCER CENTER ONLY)
BASOS PCT: 0 %
Basophils Absolute: 0 10*3/uL (ref 0.0–0.1)
EOS ABS: 0.2 10*3/uL (ref 0.0–0.5)
EOS PCT: 2 %
HCT: 30 % — ABNORMAL LOW (ref 34.8–46.6)
HEMOGLOBIN: 9.9 g/dL — AB (ref 11.6–15.9)
Lymphocytes Relative: 10 %
Lymphs Abs: 0.7 10*3/uL — ABNORMAL LOW (ref 0.9–3.3)
MCH: 28.5 pg (ref 25.1–34.0)
MCHC: 33 g/dL (ref 31.5–36.0)
MCV: 86.5 fL (ref 79.5–101.0)
MONO ABS: 0.6 10*3/uL (ref 0.1–0.9)
MONOS PCT: 7 %
NEUTROS PCT: 81 %
Neutro Abs: 6.3 10*3/uL (ref 1.5–6.5)
PLATELETS: 326 10*3/uL (ref 145–400)
RBC: 3.47 MIL/uL — ABNORMAL LOW (ref 3.70–5.45)
RDW: 14 % (ref 11.2–14.5)
WBC Count: 7.8 10*3/uL (ref 3.9–10.3)

## 2017-06-21 LAB — MAGNESIUM: Magnesium: 1.8 mg/dL (ref 1.7–2.4)

## 2017-06-21 LAB — PHOSPHORUS: PHOSPHORUS: 3.1 mg/dL (ref 2.5–4.6)

## 2017-06-21 LAB — URIC ACID: Uric Acid, Serum: 8.2 mg/dL — ABNORMAL HIGH (ref 2.6–7.4)

## 2017-06-21 LAB — LACTATE DEHYDROGENASE: LDH: 707 U/L — ABNORMAL HIGH (ref 125–245)

## 2017-06-21 MED ORDER — PREDNISONE 20 MG PO TABS: 40.0000 mg/m2 | ORAL_TABLET | Freq: Every day | ORAL | 6 refills | Status: AC

## 2017-06-21 MED ORDER — OMEPRAZOLE 20 MG PO CPDR: 20.0000 mg | DELAYED_RELEASE_CAPSULE | Freq: Two times a day (BID) | ORAL | 3 refills | Status: AC

## 2017-06-21 MED ORDER — ACYCLOVIR 400 MG PO TABS: 400.0000 mg | ORAL_TABLET | Freq: Two times a day (BID) | ORAL | 3 refills | Status: AC

## 2017-06-21 MED ORDER — ALLOPURINOL 300 MG PO TABS: 300.0000 mg | ORAL_TABLET | Freq: Every day | ORAL | 3 refills | Status: AC

## 2017-06-21 NOTE — Assessment & Plan Note (Signed)
84 y.o. 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  Patient's performance status and age are limiting our ability to conduct chemotherapy at this time, but without urgent intervention, patient will likely suffer progressive and rapid deterioration and early death.  Our differential at this time includes diffuse large B-cell lymphoma, Burkitt's lymphoma, etc. patient is at risk for spontaneous tumor lysis as indicated by her elevated uric acid.  We have conducted extensive discussion with the family today spending over 1 hour of direct patient contact in our clinic discussing the diagnosis, prognosis, and potential management strategies.  Option for hospice has been breached, but patient is more interested in improving her well-being and potentially obtaining a long-lasting remission for her disease.  To that end, I do recommend patient to undergo multiagent chemoimmunotherapy.  Due to her advanced age and performance status, choices are limited.  At this time, I believe that rituximab added to mini-CHOP offers [place] the best balance between risks and benefits.  If disease improves and patient's performance status rises, we can intensify therapy if necessary.  Based on elevation of LDH and presence of skeletal disease, optimally, CNS prophylaxis should be considered.  Plan: -Labs today as outlined below. -Add testing t(4;14) to evaluate for possible Burkitt's lymphoma that would necessitate intensification of therapy - Start prednisone 60 mg daily x5 days as part of the first cycle of systemic therapy -Start allopurinol 300 mg daily. -Start omeprazole 20 mg twice daily for stress ulcer prophylaxis. Start acyclovir 400 mg twice daily due to previous history of oral aphthous ulcers. - Return to the hospital for direct admission on 06/24/2017 to undergo remainder of the first cycle of systemic chemoimmunotherapy under close observation  due to risk of tumor lysis and toxicity from the treatment. -At this time, I do not intend using growth factors due to additional potential for side effects. - Patient already received first dose of zoledronic acid.  If renal function improves, additional doses of Zometa will be administered at a later time

## 2017-06-21 NOTE — Telephone Encounter (Signed)
Per Dr. Lebron Conners, patient for inpatient admission on Monday May 6th, 2019 for mini-R CHOP. Spoke to [employer] in bed placement and made a request for a bed. Call will be placed to patient's husband on Monday with bed assignment. Spoke to [employer] on 3W and made her aware of upcoming admission. Also spoke to [place] in inpatient pharmacy and made him aware. Staff message sent to D. Rolley Sims for pre auth

## 2017-06-21 NOTE — Progress Notes (Signed)
[facility] Follow-up Visit:  Assessment: Large B-cell lymphoma ([place]) 84 y.o. 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  Patient's performance status and age are limiting our ability to conduct chemotherapy at this time, but without urgent intervention, patient will likely suffer progressive and rapid deterioration and early death.  Our differential at this time includes diffuse large B-cell lymphoma, Burkitt's lymphoma, etc. patient is at risk for spontaneous tumor lysis as indicated by her elevated uric acid.  We have conducted extensive discussion with the family today spending over 1 hour of direct patient contact in our clinic discussing the diagnosis, prognosis, and potential management strategies.  Option for hospice has been breached, but patient is more interested in improving her well-being and potentially obtaining a long-lasting remission for her disease.  To that end, I do recommend patient to undergo multiagent chemoimmunotherapy.  Due to her advanced age and performance status, choices are limited.  At this time, I believe that rituximab added to mini-CHOP offers [place] the best balance between risks and benefits.  If disease improves and patient's performance status rises, we can intensify therapy if necessary.  Based on elevation of LDH and presence of skeletal disease, optimally, CNS prophylaxis should be considered.  Plan: -Labs today as outlined below. -Add testing t(4;14) to evaluate for possible Burkitt's lymphoma that would necessitate intensification of therapy - Start prednisone 60 mg daily x5 days as part of the first cycle of systemic therapy -Start allopurinol 300 mg daily. -Start omeprazole 20 mg twice daily for stress ulcer prophylaxis. Start acyclovir 400 mg twice daily due to previous history of oral aphthous ulcers. - Return to the hospital for direct admission on 06/24/2017 to  undergo remainder of the first cycle of systemic chemoimmunotherapy under close observation due to risk of tumor lysis and toxicity from the treatment. -At this time, I do not intend using growth factors due to additional potential for side effects. - Patient already received first dose of zoledronic acid.  If renal function improves, additional doses of Zometa will be administered at a later time   Voice recognition software was used and creation of this note. Despite my best effort at editing the text, some misspelling/errors may have occurred.  Orders Placed This Encounter  Procedures  . CBC with Differential (Cancer Center Only)    Standing Status:   Future    Number of Occurrences:   1    Standing Expiration Date:   06/22/2018  . CMP ([facility] only)    Standing Status:   Future    Number of Occurrences:   1    Standing Expiration Date:   06/22/2018  . Lactate dehydrogenase (LDH)    Standing Status:   Future    Number of Occurrences:   1    Standing Expiration Date:   06/21/2018  . Magnesium    Standing Status:   Future    Number of Occurrences:   1    Standing Expiration Date:   06/21/2018  . Uric acid    Standing Status:   Future    Number of Occurrences:   1    Standing Expiration Date:   06/21/2018  . Phosphorus    Standing Status:   Future    Number of Occurrences:   1    Standing Expiration Date:   06/21/2018  . Hepatitis B surface antibody    Standing Status:   Future  Number of Occurrences:   1    Standing Expiration Date:   06/21/2018  . Hepatitis B surface antigen    Standing Status:   Future    Number of Occurrences:   1    Standing Expiration Date:   06/21/2018  . Hepatitis B core antibody, total    Standing Status:   Future    Number of Occurrences:   1    Standing Expiration Date:   06/21/2018  . Hepatitis B E antigen    Standing Status:   Future    Number of Occurrences:   1    Standing Expiration Date:   06/21/2018  . Hepatitis C antibody (reflex if positive)     Standing Status:   Future    Number of Occurrences:   1    Standing Expiration Date:   06/21/2018    Cancer Staging No matching staging information was found for the patient.  All questions were answered.  . The patient knows to call the clinic with any problems, questions or concerns.  This note was electronically signed.    History of Presenting Illness Monique Crosby 84 y.o. presenting to the [facility] for new diagnosis of large B-cell lymphoma, NOS.  Patient was initially seen during her hospitalization from 04/23-05/01/19 when she was admitted for presentation with severe hypercalcemia with calcium exceeding 14.0 in the outside labs.  She was accompanied by progressive deterioration of performance status that has been occurring over the preceding 6 months.  Prior to the six-month mark, patient has been highly functional and active with excellent appetite.  Over the time, patient has been less hungry, has been losing weight rapidly and has been having increasing difficulties with confusion and memory problems.  Patient had no fevers, chills, night sweats.  Minimal diarrhea without hematochezia or melena.  No dysuria or hematuria at the time.  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  She also has history of colonic diverticulosis, depression, GERD, and recurrent oral ulcers.  No previous history of cytotoxic chemotherapy, or exposure to solvents/toxins.  Family history is significant for mother with lung cancer, father with prostate and liver cancer.  Patient is a former smoker and does not drink alcohol.  She quit smoking in 1980.  During hospitalization, patient was aggressively hydrated and received zoledronic acid and calcitonin to reduce the calcium burden.  Extensive imaging evaluation was conducted and eventually patient underwent a core needle biopsy of retroperitoneal lymphadenopathy  revealing presence of lymphoproliferative disorder as outlined below.  During the treatment, patient has recovered her mentation and resumed some activity although she remains profoundly weak and tired, unable to stand up on her own without assistance.  Patient denies any loss of sensation or paresthesias in the lower extremities, perineum, or hands.  No active shortness of breath or chest pain.  Appetite remains minimal.  Oncological/hematological History:   Large B-cell lymphoma ([place])   06/12/2017 Imaging    US Abdomen: A mass lesion in or adjacent to the tail of the pancreas measuring about 2cm maximal diameter. Additional nodules in the retroperitoneum and around the pancreas likely represent enlarged lymph nodes. Heterogeneous mass in the region of the splenic hilum measures up to 6.7 cm diameter. No pancreatic ductal dilatation or peripancreatic fluid.      06/13/2017 Imaging    Bone Scan: Multifocal areas of increased activity noted over the cervical, thoracic, and lumbar spine particularly  over the lower left lumbar spine. Questionable areas of increased activity noted over anterior ribs bilaterally. These areas could represent metastatic disease.      06/15/2017 Imaging    MRI Brain: Atrophy and small vessel disease.  No acute intracranial findings.      06/15/2017 Imaging    MRI Abdomen/Pelvis: Pancreas: No definite pancreatic mass on this unenhanced exam. No evidence of pancreatic ductal dilatation or pancreas divisum. Spleen:  Within normal limits in size. Adrenals/Urinary tract: Unremarkable. No evidence of hydronephrosis. Vascular/Lymphatic: Bulky lymphadenopathy is seen in the upper abdomen within the gastrohepatic and gastrosplenic ligaments, left cardiophrenic angle, and porta hepatis. Bulky lymphadenopathy is also seen throughout the abdominal retroperitoneum. Index area in the retrocaval space measures 3.3 cm.       06/18/2017 Imaging    CT A/P wo C: Bulky retroperitoneal and  upper abdominal adenopathy. Epicardial adenopathy. Findings suspicious for tumor possibly lymphoma although other types of tumor not excluded. Peritoneal spread of tumor. Ascites. Pleural effusions.      06/18/2017 Pathology Results    Retroperitoneal LN Core Bx: The sections show a few small needle core biopsy fragments of soft tissue displaying a dense infiltrate of primarily large lymphoid cells with vesicular chromatin and prominent nucleoli associated with apoptosis, small foci of necrosis and brisk mitosis. The appearance is primarily diffuse with lack of atypical follicles. Flow cytometric analysis was attempted ([id number]) but there was insufficient cells present for analysis. IHC -- positive for LCA, CD20, CD79a, BCL-2, BCL-6 & negative for CD10, CD138, CD34, CD30, cyclin D1 or TdT. There is a minor T-cell population in the background as primarily seen with CD3 and CD5. There is no apparent coexpression of CD5 in B-cell areas. The histologic and immunophenotypic features are consistent with large B-cell lymphoma which appears to show a diffuse pattern in these limited biopsies.       06/21/2017 Initial Diagnosis    Large B-cell lymphoma ([place])      06/21/2017 Tumor Marker    LDH 707, beta-2 microglobulin ..., Uric acid 8.2; WBC 7.8, Hgb 9.9, Plt 326; Ca 10.0, Alb 3.1, Cr 2.4;       Chemotherapy    R-miniCHOP: Rituximab 34m/m2, d1 + Cyclophosphamide 4081mm2, d1 + Doxorubicin 2544m2, d1 + Vincristine 1mg47m1 + Prednisone 40mg87m d1-5 Q21d x6 cycles planned       Medical History: Past Medical History:  Diagnosis Date  . ARTHRITIS, RHEUMATOID 11/29/2006  . [place] cancer [place]  . BURSITIS, SHOULDER 08/09/2008  . DCIS (ductal carcinoma in situ)   . DEPRESSION 02/24/2007  . DIVERTICULOSIS, COLON 02/24/2007  . External hemorrhoids   . GERD 02/24/2007  . Hypercalcemia   . OSTEOPENIA 11/29/2006  . Personal history of radiation therapy 02/1998    Surgical History: Past Surgical  History:  Procedure Laterality Date  . ABDOMINAL HYSTERECTOMY    . APPENDECTOMY    . BREAST BIOPSY Left 01/03/1998   malignant  . BREAST LUMPECTOMY Left 01/1998  . CATARACT EXTRACTION     righ and left  . CHOLECYSTECTOMY    . COLONOSCOPY  11/13/2002   diverticulosis, external hemorrhoids  . DILATION AND CURETTAGE OF UTERUS    . Fatty Tumor Excision     rigth forearm  . RECTOCELE REPAIR     and cystocele  . ROTATOR CUFF REPAIR  2010   left, [facility]  01/25/2011   Normal    Family History: Family History  Problem Relation Age  of Onset  . Lung cancer Mother   . Coronary artery disease Mother   . Prostate cancer Father   . Liver cancer Father   . Kidney failure Brother   . Coronary artery disease Brother   . Thyroid disease Sister     Social History: Social History   Socioeconomic History  . Marital status: Married    Spouse name: Not on file  . Number of children: 1  . Years of education: Not on file  . Highest education level: Not on file  Occupational History  . Occupation: retired  Scientific laboratory technician  . Financial resource strain: Not on file  . Food insecurity:    Worry: Not on file    Inability: Not on file  . Transportation needs:    Medical: Not on file    Non-medical: Not on file  Tobacco Use  . Smoking status: Former Smoker    Last attempt to quit: 02/19/1978    Years since quitting: 39.3  . Smokeless tobacco: Never Used  Substance and Sexual Activity  . Alcohol use: No  . Drug use: No  . Sexual activity: Not on file  Lifestyle  . Physical activity:    Days per week: Not on file    Minutes per session: Not on file  . Stress: Not on file  Relationships  . Social connections:    Talks on phone: Not on file    Gets together: Not on file    Attends religious service: Not on file    Active member of club or organization: Not on file    Attends meetings of clubs or organizations: Not on file    Relationship status:  Not on file  . Intimate partner violence:    Fear of current or ex partner: Not on file    Emotionally abused: Not on file    Physically abused: Not on file    Forced sexual activity: Not on file  Other Topics Concern  . Not on file  Social History Narrative   1 cup of coffee daily. Stopped 4 months ago    Allergies: Allergies  Allergen Reactions  . Amoxicillin Other (See Comments)    Severe yeast infections  . Reglan [Metoclopramide] Other (See Comments)    Chest pain    Medications:  Current Outpatient Medications  Medication Sig Dispense Refill  . acetaminophen (TYLENOL) 325 MG tablet Take 2 tablets (650 mg total) by mouth every 6 (six) hours as needed for mild pain (or Fever >/= 101).    Marland Kitchen amLODipine (NORVASC) 5 MG tablet Take 1 tablet (5 mg total) by mouth daily. 30 tablet 0  . buPROPion (WELLBUTRIN XL) 300 MG 24 hr tablet Take 1 tablet (300 mg total) by mouth daily. 90 tablet 6  . oxyCODONE-acetaminophen (PERCOCET/ROXICET) 5-325 MG per tablet Take 1 tablet by mouth every 4 (four) hours as needed. 15 tablet 0  . sucralfate (CARAFATE) 1 G tablet TAKE 1 TABLET BY MOUTH 4 TIMES A DAY 360 tablet 1  . acyclovir (ZOVIRAX) 400 MG tablet Take 1 tablet (400 mg total) by mouth 2 (two) times daily. 60 tablet 3  . allopurinol (ZYLOPRIM) 300 MG tablet Take 1 tablet (300 mg total) by mouth daily. 30 tablet 3  . omeprazole (PRILOSEC) 20 MG capsule Take 1 capsule (20 mg total) by mouth 2 (two) times daily before a meal. 60 capsule 3  . predniSONE (DELTASONE) 20 MG tablet Take 3 tablets (60 mg total) by mouth daily with breakfast  for 5 days. 15 tablet 6   No current facility-administered medications for this visit.     Review of Systems: Review of Systems  Constitutional: Positive for appetite change, fatigue and unexpected weight change. Negative for diaphoresis and fever.  Gastrointestinal: Positive for abdominal pain and constipation. Negative for nausea and vomiting.   Musculoskeletal: Positive for back pain.  All other systems reviewed and are negative.    PHYSICAL EXAMINATION Blood pressure 118/64, pulse 97, temperature (!) 97.5 F (36.4 C), temperature source Oral, resp. rate 17, height 5' 3"  (1.6 m), weight 129 lb 4.8 oz (58.7 kg), SpO2 98 %.  ECOG PERFORMANCE STATUS: 3 - Symptomatic, >50% confined to bed  Physical Exam  Constitutional: She is oriented to person, place, and time.  Frail elderly female who appears fatigued, but in no acute distress to moment.  HENT:  Head: Normocephalic and atraumatic.  Mouth/Throat: Oropharynx is clear and moist. No oropharyngeal exudate.  Eyes: Pupils are equal, round, and reactive to light. Conjunctivae and EOM are normal. No scleral icterus.  Neck: No thyromegaly present.  Cardiovascular: Normal rate and regular rhythm.  Murmur heard. Pulmonary/Chest: Effort normal and breath sounds normal. No stridor. No respiratory distress. She has no wheezes. She has no rales.  Abdominal: Soft. Bowel sounds are normal. She exhibits no distension and no mass. There is no tenderness. There is no guarding.  Musculoskeletal: She exhibits no edema.  Lymphadenopathy:    She has no cervical adenopathy.  Neurological: She is alert and oriented to person, place, and time. She displays normal reflexes. No cranial nerve deficit or sensory deficit.  Skin: Skin is warm and dry. No rash noted. No erythema. There is pallor.     LABORATORY DATA: I have personally reviewed the data as listed: Appointment on 06/21/2017  Component Date Value Ref Range Status  . Phosphorus 06/21/2017 3.1  2.5 - 4.6 mg/dL Final   Performed at [facility] [address], [place], [place] [address]  . Uric Acid, Serum 06/21/2017 8.2* 2.6 - 7.4 mg/dL Final   Performed at [facility], [place] [address], [place], [place] [address]  . Magnesium 06/21/2017 1.8  1.7 - 2.4 mg/dL Final   Performed at [facility], [place] [address], [place], [place] [address]  . LDH 06/21/2017 707* 125 - 245 U/L Final   Performed at [facility], [place] [address], [place], [place] [address]  . Sodium 06/21/2017 126* 136 - 145 mmol/L Final  . Potassium 06/21/2017 4.4  3.5 - 5.1 mmol/L Final  . Chloride 06/21/2017 100  98 - 109 mmol/L Final  . CO2 06/21/2017 18* 22 - 29 mmol/L Final  . Glucose, Bld 06/21/2017 80  70 - 140 mg/dL Final  . BUN 06/21/2017 38* 7 - 26 mg/dL Final  . Creatinine 06/21/2017 2.36* 0.60 - 1.10 mg/dL Final  . Calcium 06/21/2017 10.0  8.4 - 10.4 mg/dL Final  . Total Protein 06/21/2017 6.0* 6.4 - 8.3 g/dL Final  . Albumin 06/21/2017 3.1* 3.5 - 5.0 g/dL Final  . AST 06/21/2017 95* 5 - 34 U/L Final  . ALT 06/21/2017 35  0 - 55 U/L Final  . Alkaline Phosphatase 06/21/2017 100  40 - 150 U/L Final  . Total Bilirubin 06/21/2017 0.2  0.2 - 1.2 mg/dL Final  . GFR, Est Non Af Am 06/21/2017 18* >60 mL/min Final  . GFR, Est AFR Am 06/21/2017 21* >60 mL/min Final   Comment: (NOTE)  The eGFR has been calculated using the CKD EPI equation. This calculation has not been validated in all clinical situations. eGFR's persistently <60 mL/min signify possible Chronic Kidney Disease.   Georgiann Hahn gap 06/21/2017 8  3 - 11 Final   Performed at [facility], [place] [address], [place], [place] [address]  . WBC Count 06/21/2017 7.8  3.9 - 10.3 K/uL Final  . RBC 06/21/2017 3.47* 3.70 - 5.45 MIL/uL Final  . Hemoglobin 06/21/2017 9.9* 11.6 - 15.9 g/dL Final  . HCT 06/21/2017 30.0* 34.8 - 46.6 % Final  . MCV 06/21/2017 86.5  79.5 - 101.0 fL Final  . MCH 06/21/2017 28.5  25.1 - 34.0 pg Final  . MCHC 06/21/2017 33.0  31.5 - 36.0 g/dL Final  . RDW 06/21/2017 14.0  11.2 - 14.5 % Final  . Platelet Count 06/21/2017 326  145 - 400 K/uL Final  . Neutrophils Relative % 06/21/2017 81  % Final  . Neutro Abs 06/21/2017 6.3  1.5 - 6.5 K/uL Final  . Lymphocytes  Relative 06/21/2017 10  % Final  . Lymphs Abs 06/21/2017 0.7* 0.9 - 3.3 K/uL Final  . Monocytes Relative 06/21/2017 7  % Final  . Monocytes Absolute 06/21/2017 0.6  0.1 - 0.9 K/uL Final  . Eosinophils Relative 06/21/2017 2  % Final  . Eosinophils Absolute 06/21/2017 0.2  0.0 - 0.5 K/uL Final  . Basophils Relative 06/21/2017 0  % Final  . Basophils Absolute 06/21/2017 0.0  0.0 - 0.1 K/uL Final   Performed at [facility], [place] [address], [place], [place] [address]  Admission on 06/11/2017, Discharged on 06/19/2017  No results displayed because visit has over 200 results.         Ardath Sax, MD

## 2017-06-22 LAB — HEPATITIS B SURFACE ANTIBODY,QUALITATIVE: Hep B S Ab: NONREACTIVE

## 2017-06-22 LAB — HEPATITIS B CORE ANTIBODY, TOTAL: Hep B Core Total Ab: NEGATIVE

## 2017-06-22 LAB — HEPATITIS B SURFACE ANTIGEN: Hepatitis B Surface Ag: NEGATIVE

## 2017-06-22 LAB — HCV COMMENT:

## 2017-06-22 LAB — HEPATITIS B E ANTIGEN: Hep B E Ag: NEGATIVE

## 2017-06-22 LAB — HEPATITIS C ANTIBODY (REFLEX)

## 2017-06-24 ENCOUNTER — Inpatient Hospital Stay
Admission: RE | Admit: 2017-06-24 | Payer: Medicare Other | Source: Ambulatory Visit | Admitting: Hematology and Oncology

## 2017-06-24 ENCOUNTER — Telehealth: Payer: Self-pay | Admitting: *Deleted

## 2017-06-24 ENCOUNTER — Other Ambulatory Visit: Payer: Self-pay | Admitting: Hematology and Oncology

## 2017-06-24 DIAGNOSIS — C8510 Unspecified B-cell lymphoma, unspecified site: Secondary | ICD-10-CM

## 2017-06-24 NOTE — Telephone Encounter (Signed)
"  I need to speak with someone about my mom's hospice referral.  Hospice denies receipt of any referral.  Why was referral not made?  We were told if she did not receive chemotherapy today she would begin Hospice.  My dad called Saturday and this morning so we do not understand why he was called that she missed today's appointment.  Mom is not eating, Dad has to pick her up and carry her, even has to place her on the potty.  Takes two people to bath her.  She's totally incapacitated."   Noted families may request hospice who will then contact office for order.

## 2017-06-24 NOTE — Telephone Encounter (Signed)
The hospice referral wade. Hospice will be contacting the family directly

## 2017-06-25 ENCOUNTER — Telehealth: Payer: Self-pay

## 2017-06-25 NOTE — Telephone Encounter (Signed)
I called Hospice and spoke with Amber.  The patients information was given and Amber stated she would call the patients PCP and then call the patients family and set up the hospice service.

## 2017-07-20 DEATH — deceased

## 2018-12-14 IMAGING — CT CT ABD-PELV W/O CM
2 of 4 series · 16 of 46 positions shown, 18 images · non-contrast
Comparison: 06/15/2017 MR abdomen. [place] hundred twenty-five 5721
bone scan. 06/12/2017 abdominal sonogram.

CLINICAL DATA: 84-year-old female with breast cancer and rheumatoid
arthritis. Presenting with hypercalcemia. Prior hysterectomy,
cholecystectomy and appendectomy. Subsequent encounter.

EXAM:
CT ABDOMEN AND PELVIS WITHOUT CONTRAST
TECHNIQUE: Multidetector CT imaging of the abdomen and pelvis was performed
following the standard protocol without IV contrast.

[Series 2: axial st · axial · 0.75mm/px · z∈[-803,-443]mm · 13 of 81 slices shown, 15 images]
[im 5/81  soft-tissue]
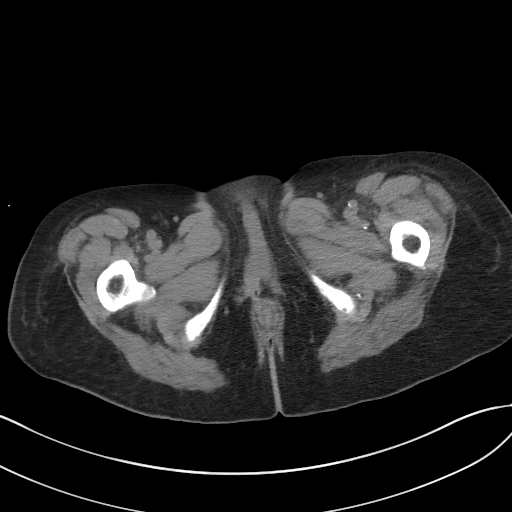
[im 5/81  bone]
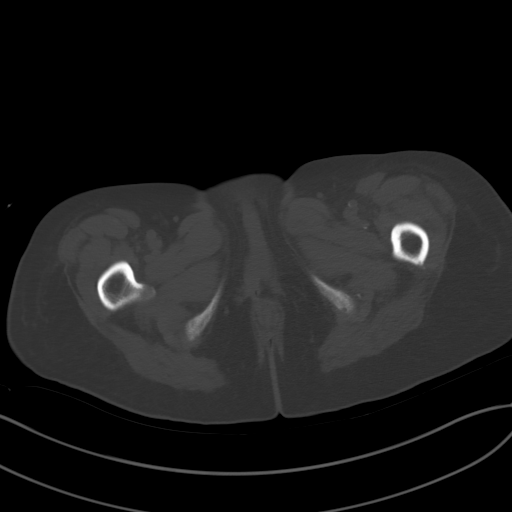
[im 13/81  soft-tissue]
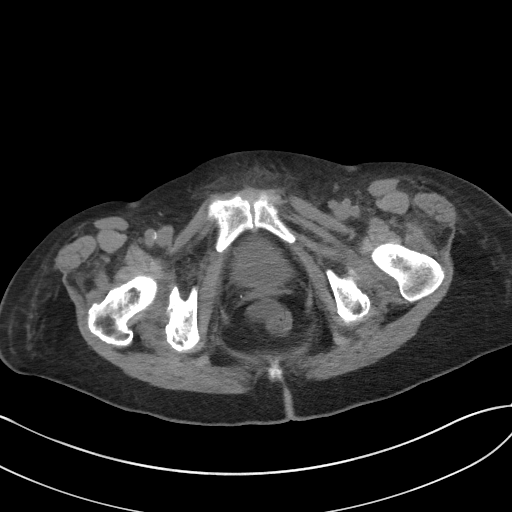
[im 17/81  soft-tissue]
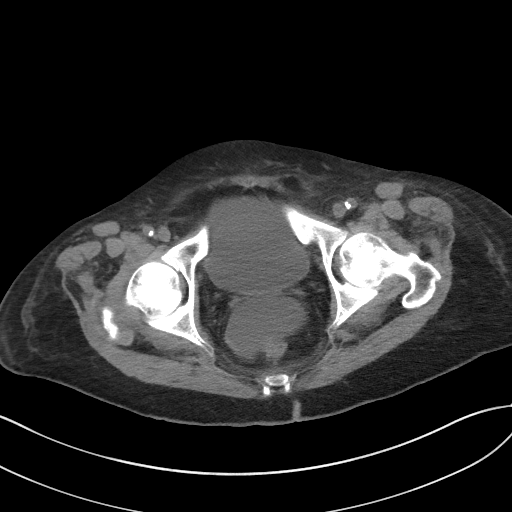
[im 25/81  soft-tissue]
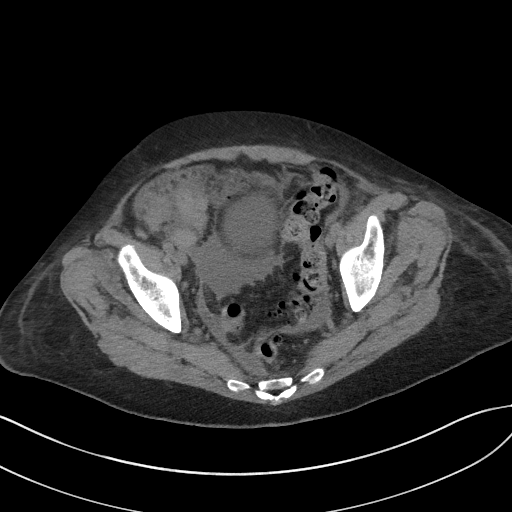
[im 29/81  soft-tissue]
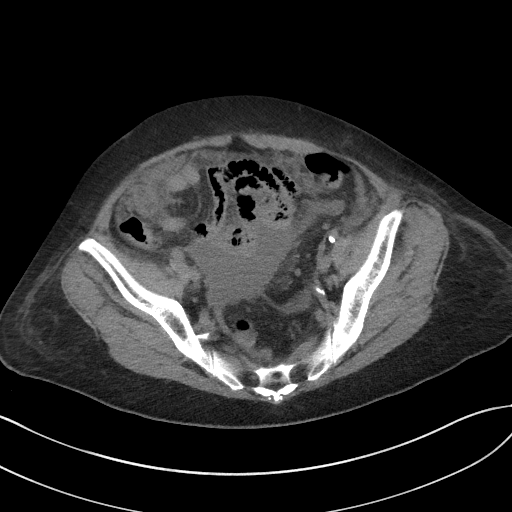
[im 37/81  soft-tissue]
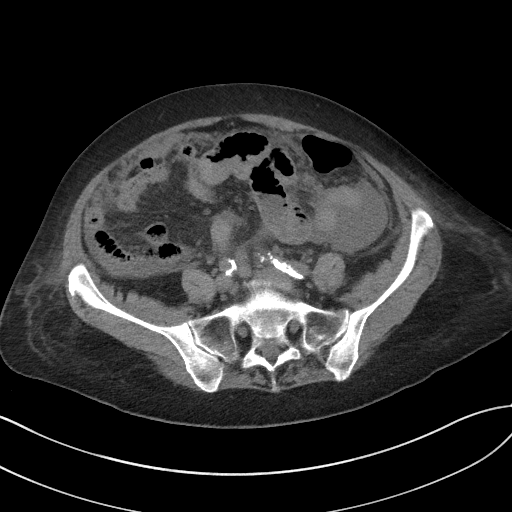
[im 41/81  soft-tissue]
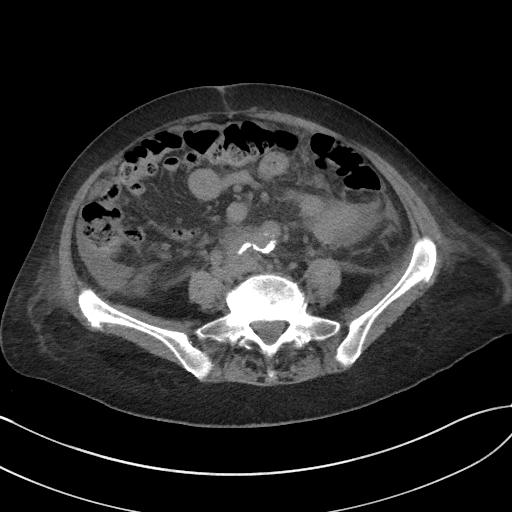
[im 45/81  soft-tissue]
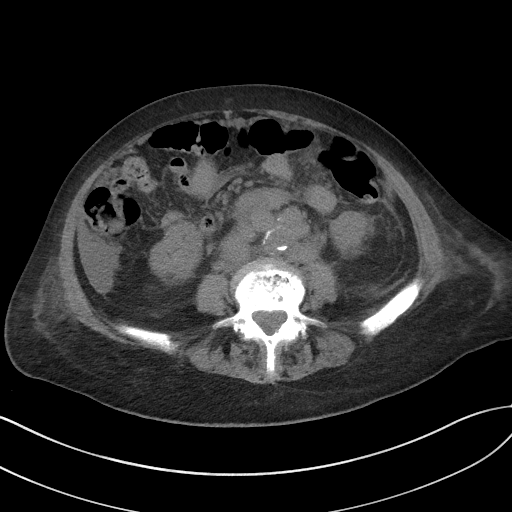
[im 53/81  soft-tissue]
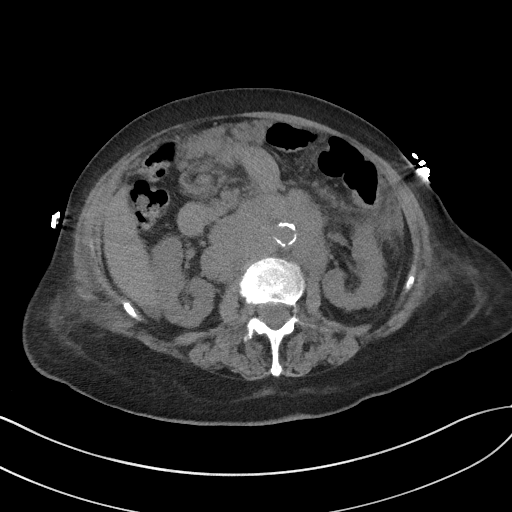
[im 53/81  bone]
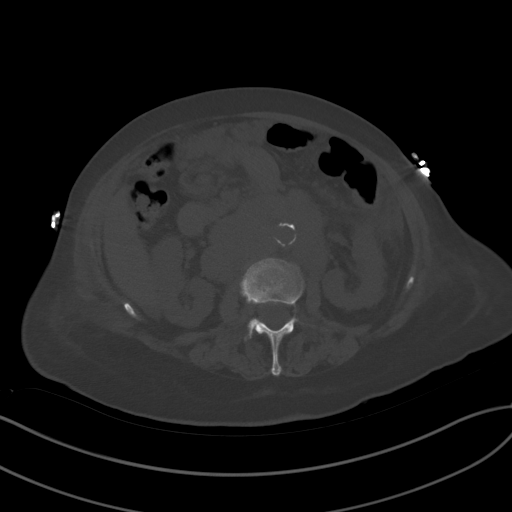
[im 57/81  soft-tissue]
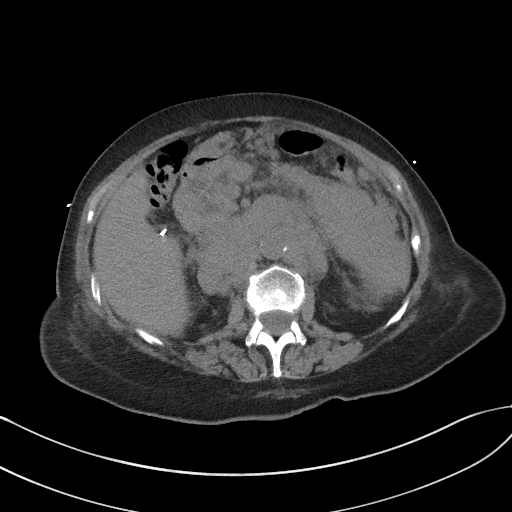
[im 65/81  soft-tissue]
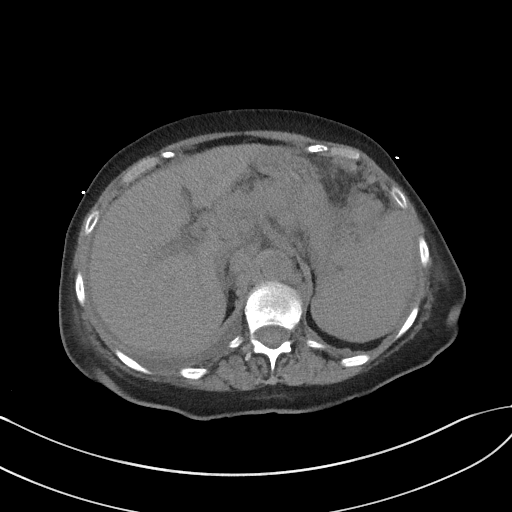
[im 69/81  soft-tissue]
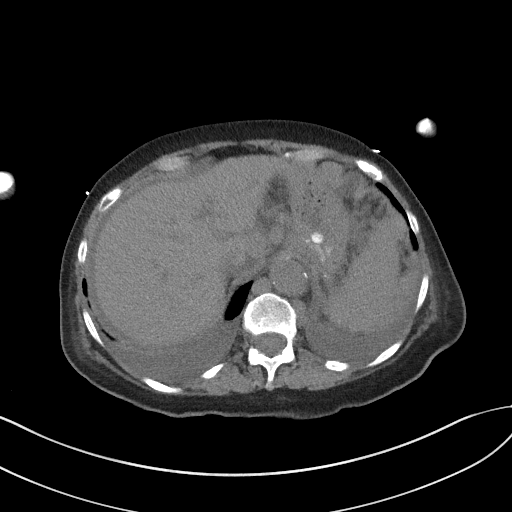
[im 77/81  soft-tissue]
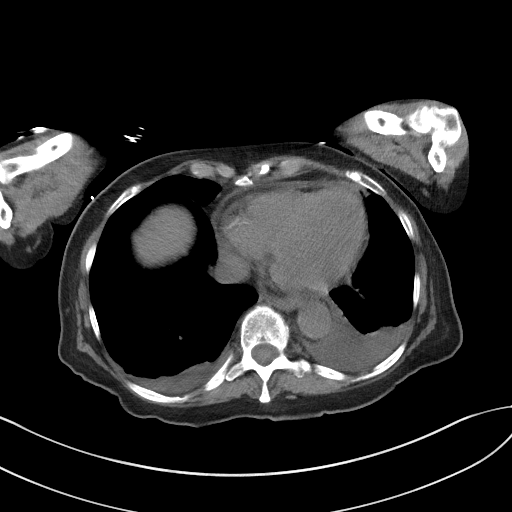

[Series 4: coronal st · coronal · 0.90mm/px · 3 of 73 slices shown]
[im 25/73  soft-tissue]
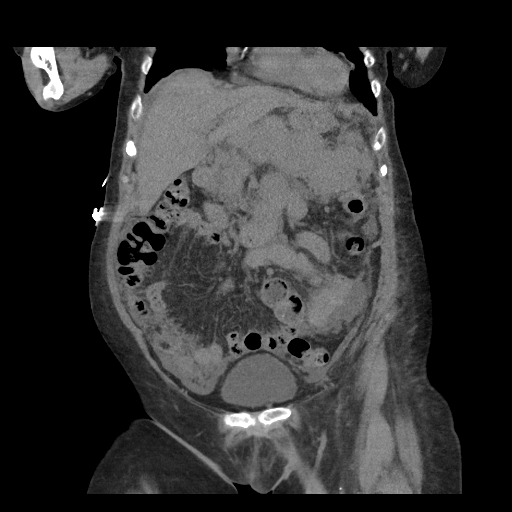
[im 33/73  soft-tissue]
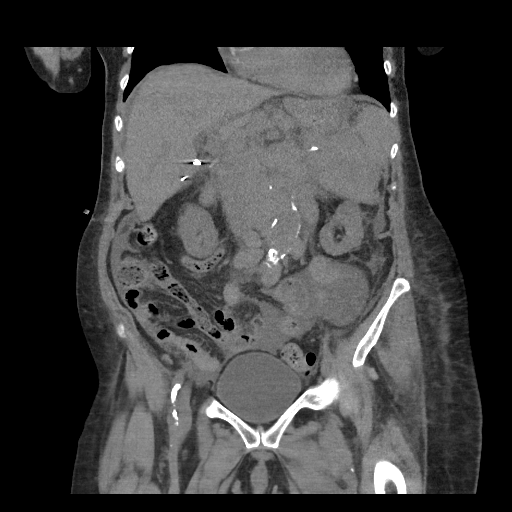
[im 41/73  soft-tissue]
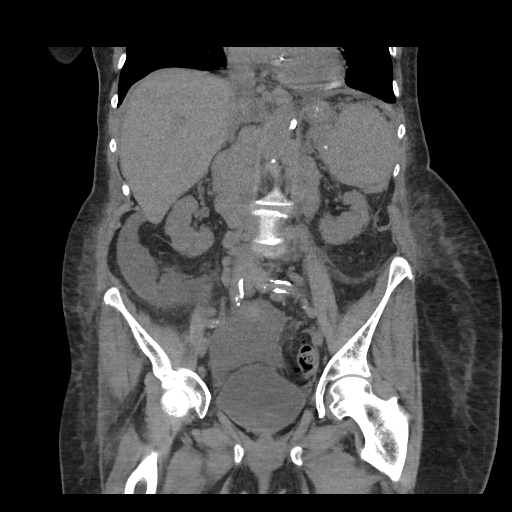

[16 of 46 positions shown; findings below may reference images not displayed]

FINDINGS: Lower chest: Small bilateral pleural effusions larger on left. Mild
left base atelectasis.

Heart size within normal limits. Coronary artery and mitral valve
calcifications. Epicardial adenopathy.

Hepatobiliary: Taking into account limitation by non contrast
imaging, no worrisome hepatic lesion. Post cholecystectomy. No
calcified common bile duct stone.

Pancreas: Evaluation of the pancreas is limited secondary to lack of
contrast and upper abdominal adenopathy.

Spleen: Taking into account limitation by non contrast imaging, no
splenic mass or enlargement.

Adrenals/Urinary Tract: Taking into account limitation by non
contrast imaging, no renal or adrenal mass. No obstructing stone or
hydronephrosis. Noncontrast filled views of the urinary bladder
unremarkable.

Stomach/Bowel: Evaluation limited by lack of contrast, lack of fat
planes, under distension, distortion by adenopathy and ascites.
Scattered colonic diverticula.

Vascular/Lymphatic: Bulky retroperitoneal and upper abdominal
adenopathy (gastrohepatic, porta hepatis, celiac axis and
gastrosplenic region). Retroperitoneal adenopathy and fell ups aorta
and inferior vena cava.

Atherosclerotic changes abdominal aorta with ectasia with maximal
transverse dimension of 2.7 cm. Atherosclerotic changes femoral
arteries and iliac arteries.

Reproductive: Prior hysterectomy.  Ovaries not clearly delineated.

Other: Peritoneal spread of tumor. Ascites. No free intraperitoneal
air.

Musculoskeletal: Degenerative changes lumbar spine most notable L3-4
through L5-S1. Mild scoliosis. Mild hip joint degenerative changes.
IMPRESSION: Bulky retroperitoneal and upper abdominal adenopathy. Epicardial
adenopathy. Findings suspicious for tumor possibly lymphoma although
other types of tumor not excluded. Peritoneal spread of tumor.
Ascites. Pleural effusions.

Post hysterectomy.  Ovaries not clearly delineated.

Limited evaluation of pancreas secondary to lack of contrast and
upper abdominal adenopathy.

Aortic Atherosclerosis ([id number]). Ectatic abdominal aorta
measures up to 2.7 cm. Ectatic abdominal aorta at risk for aneurysm
development. Recommend followup by ultrasound in 5 years. This
recommendation follows ACR consensus guidelines: White Paper of the
ACR Incidental Findings Committee II on Vascular Findings. [HOSPITAL] 0881; [DATE].

Coronary artery calcifications.

## 2018-12-14 IMAGING — CT CT BIOPSY
1 of 3 series · 11 of 32 positions shown, 17 images · non-contrast
Comparison: none

INDICATION: 84-year-old with prior history of breast cancer. Patient has
extensive abdominal lymphadenopathy along with evidence of
peritoneal disease and some ascites. Patient needs a tissue
diagnosis.

[Series 2: i-spiral 5.0 b31f · axial · 0.89mm/px · z∈[-554,-449]mm · 11 of 36 slices shown, 17 images]
[im 3/36  soft-tissue]
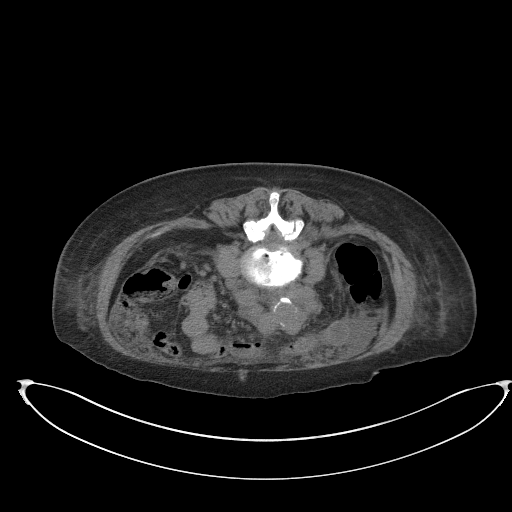
[im 3/36  bone]
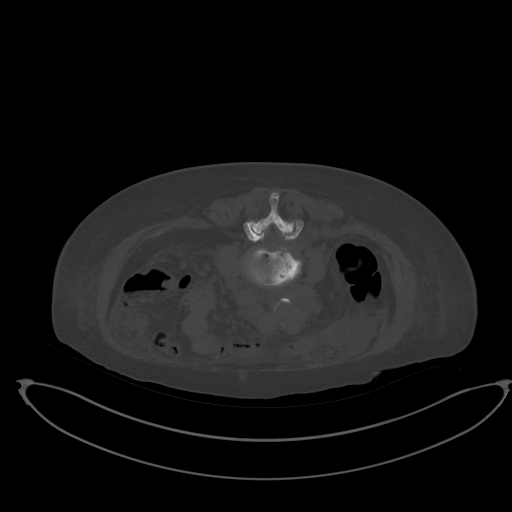
[im 6/36  soft-tissue]
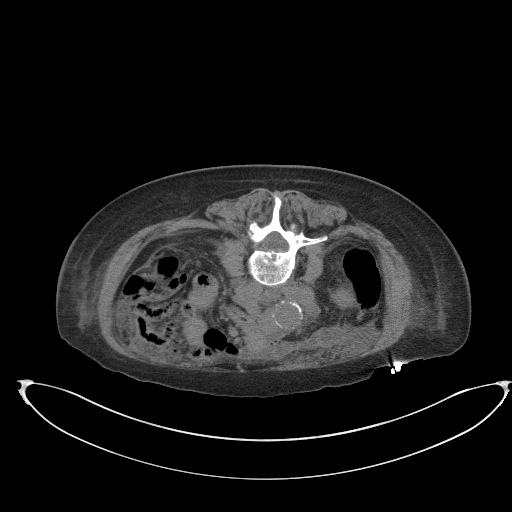
[im 9/36  soft-tissue]
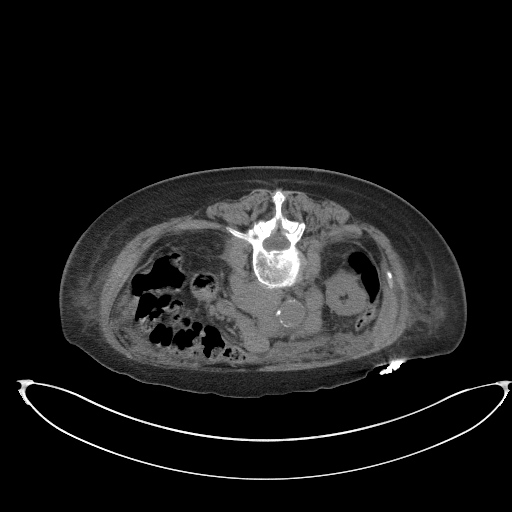
[im 12/36  soft-tissue]
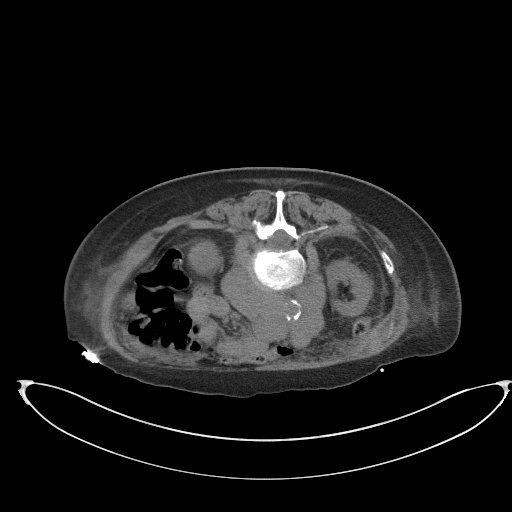
[im 15/36  soft-tissue]
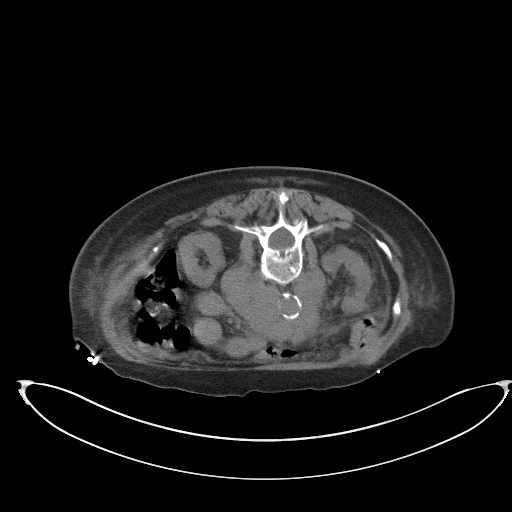
[im 18/36  soft-tissue]
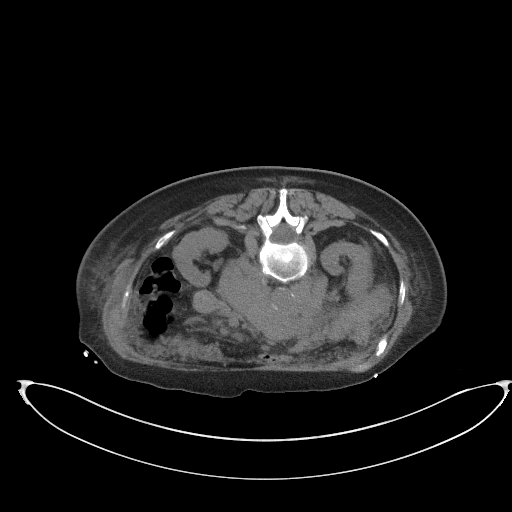
[im 21/36  soft-tissue]
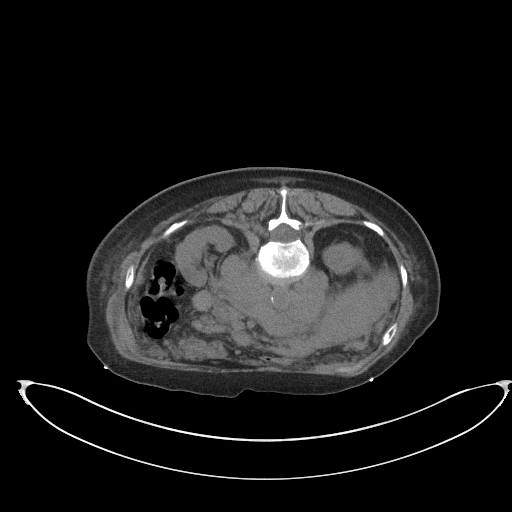
[im 24/36  soft-tissue]
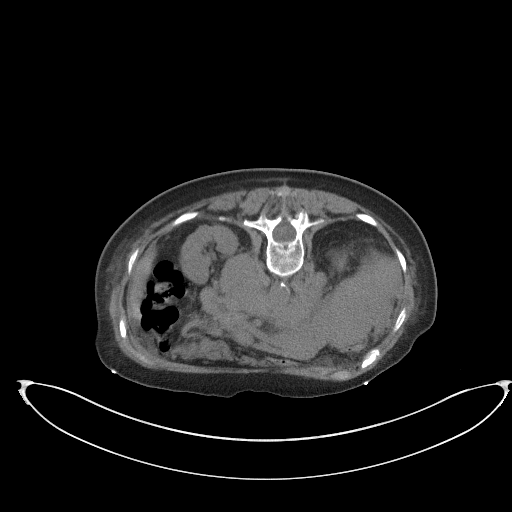
[im 24/36  lung]
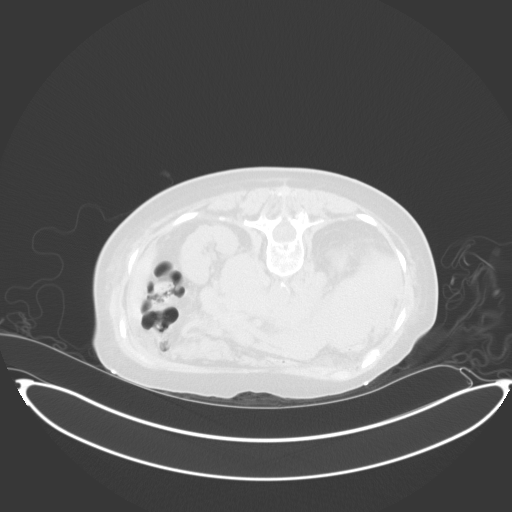
[im 27/36  soft-tissue]
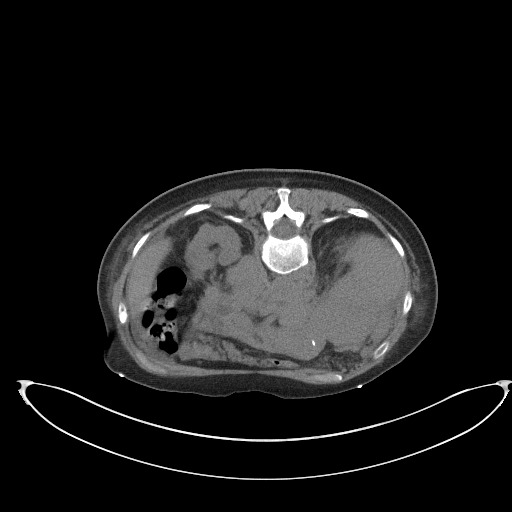
[im 27/36  lung]
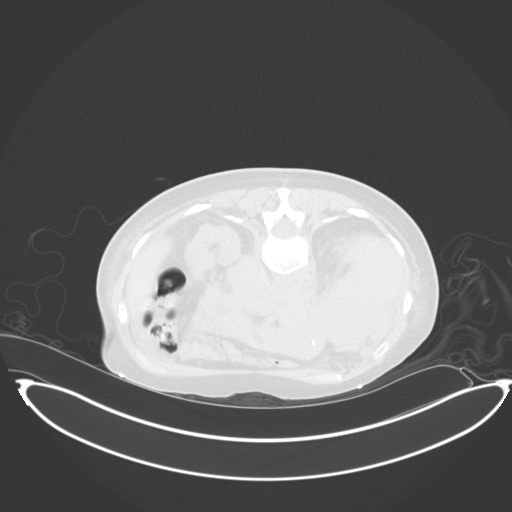
[im 27/36  bone]
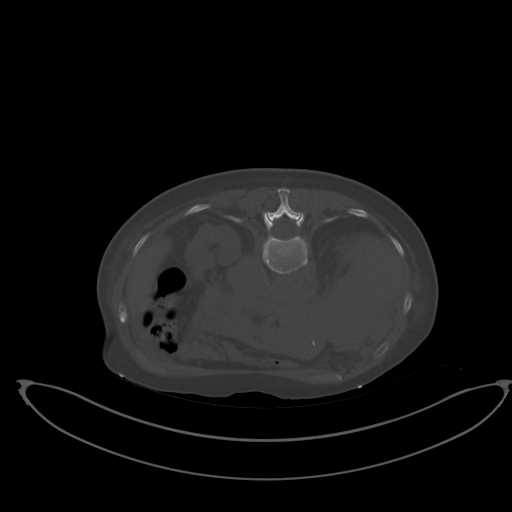
[im 30/36  soft-tissue]
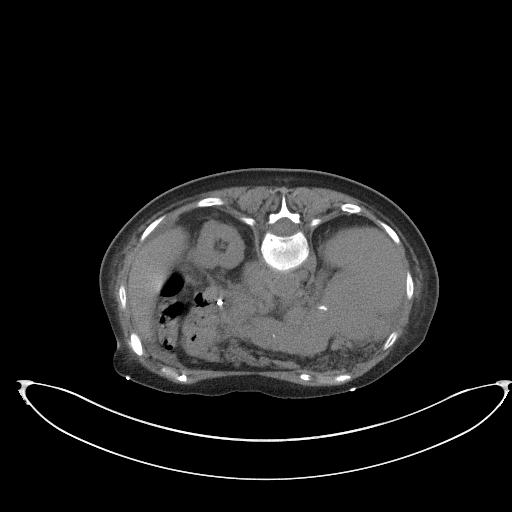
[im 30/36  lung]
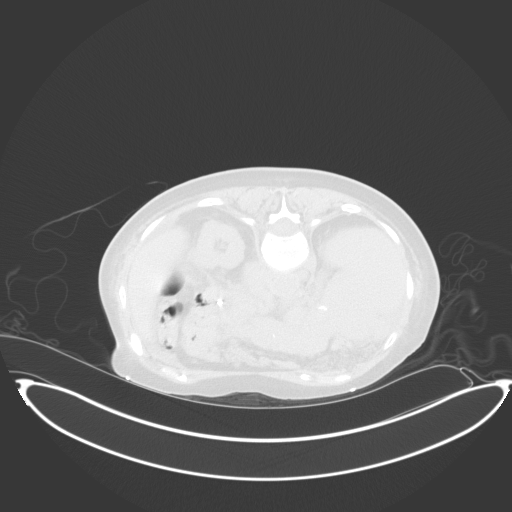
[im 33/36  soft-tissue]
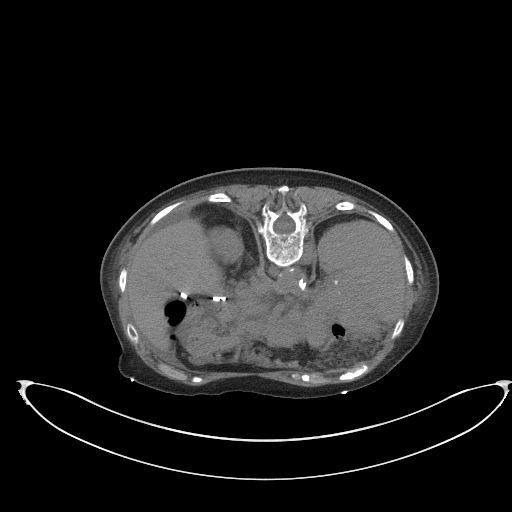
[im 33/36  lung]
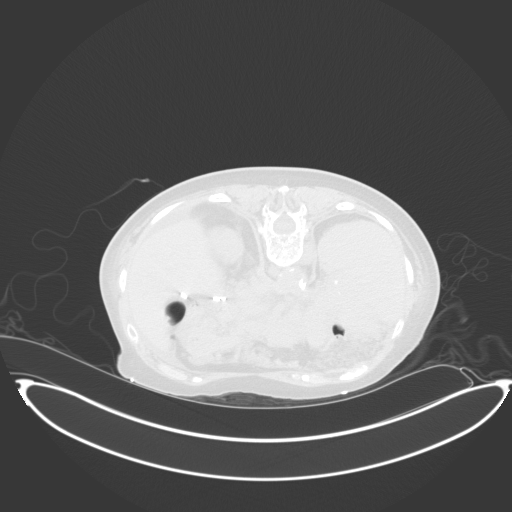

[11 of 32 positions shown; findings below may reference images not displayed]

EXAM:
CT-GUIDED BIOPSY OF RETROPERITONEAL LYMPH NODE

MEDICATIONS:
None.

ANESTHESIA/SEDATION:
Moderate (conscious) sedation was employed during this procedure. A
total of Versed 2.5 mg and Fentanyl 150 mcg was administered
intravenously.

Moderate Sedation Time: 33 minutes. The patient's level of
consciousness and vital signs were monitored continuously by
radiology nursing throughout the procedure under my direct
supervision.

FLUOROSCOPY TIME:  None

COMPLICATIONS:
None immediate.

PROCEDURE:
Informed written consent was obtained from the patient after a
thorough discussion of the procedural risks, benefits and
alternatives. All questions were addressed. A timeout was performed
prior to the initiation of the procedure.

Patient was placed prone on the CT scanner. Images through the
abdomen obtained. Left periaortic retroperitoneal lymph node was
targeted. The left side of back was prepped with chlorhexidine and
sterile field was created. Skin and soft tissues were anesthetized
with 1% lidocaine. Using CT guidance, 17 gauge coaxial needle was
directed into the left retroperitoneal lymph node. Total of 5 core
biopsies were obtained with an 18 gauge core device. Specimens
placed in saline. 17 gauge needle was removed without complication.
Bandage placed over the puncture site.
FINDINGS: Bulky retroperitoneal lymphadenopathy around the aorta. Left
periaortic lymph node was sampled at the level of the lower left
kidney. No significant bleeding or hematoma formation following the
core biopsies.
IMPRESSION: CT-guided core biopsies of a left retroperitoneal lymph node.
